# Patient Record
Sex: Female | Born: 1980 | Race: White | Hispanic: No | Marital: Married | Smoking: Former smoker
Health system: Southern US, Community
[De-identification: ages and names within clinical notes are randomized; demographics above are authoritative.]

## PROBLEM LIST (undated history)

## (undated) DIAGNOSIS — T7840XA Allergy, unspecified, initial encounter: Secondary | ICD-10-CM

## (undated) DIAGNOSIS — Z8481 Family history of carrier of genetic disease: Secondary | ICD-10-CM

## (undated) DIAGNOSIS — I10 Essential (primary) hypertension: Secondary | ICD-10-CM

## (undated) DIAGNOSIS — Z1371 Encounter for nonprocreative screening for genetic disease carrier status: Secondary | ICD-10-CM

## (undated) DIAGNOSIS — O139 Gestational [pregnancy-induced] hypertension without significant proteinuria, unspecified trimester: Secondary | ICD-10-CM

## (undated) DIAGNOSIS — E669 Obesity, unspecified: Secondary | ICD-10-CM

## (undated) DIAGNOSIS — F419 Anxiety disorder, unspecified: Secondary | ICD-10-CM

## (undated) HISTORY — DX: Essential (primary) hypertension: I10

## (undated) HISTORY — DX: Obesity, unspecified: E66.9

## (undated) HISTORY — DX: Allergy, unspecified, initial encounter: T78.40XA

## (undated) HISTORY — DX: Encounter for nonprocreative screening for genetic disease carrier status: Z13.71

## (undated) HISTORY — DX: Anxiety disorder, unspecified: F41.9

## (undated) HISTORY — DX: Gestational (pregnancy-induced) hypertension without significant proteinuria, unspecified trimester: O13.9

## (undated) HISTORY — DX: Family history of carrier of genetic disease: Z84.81

---

## 2007-07-31 ENCOUNTER — Observation Stay: Payer: Self-pay

## 2007-09-26 ENCOUNTER — Observation Stay: Payer: Self-pay | Admitting: Obstetrics & Gynecology

## 2007-10-01 ENCOUNTER — Observation Stay: Payer: Self-pay | Admitting: Obstetrics & Gynecology

## 2007-10-12 ENCOUNTER — Observation Stay: Payer: Self-pay | Admitting: Obstetrics and Gynecology

## 2007-10-14 ENCOUNTER — Inpatient Hospital Stay: Payer: Self-pay | Admitting: Unknown Physician Specialty

## 2007-10-16 DIAGNOSIS — O139 Gestational [pregnancy-induced] hypertension without significant proteinuria, unspecified trimester: Secondary | ICD-10-CM

## 2009-06-03 ENCOUNTER — Emergency Department: Payer: Self-pay | Admitting: Emergency Medicine

## 2010-01-24 ENCOUNTER — Inpatient Hospital Stay: Payer: Self-pay

## 2011-11-19 ENCOUNTER — Ambulatory Visit: Payer: Self-pay | Admitting: Obstetrics and Gynecology

## 2011-11-19 LAB — CBC WITH DIFFERENTIAL/PLATELET
Basophil %: 0.4 %
Eosinophil #: 0.1 10*3/uL (ref 0.0–0.7)
Eosinophil %: 0.8 %
HCT: 37.8 % (ref 35.0–47.0)
HGB: 12.9 g/dL (ref 12.0–16.0)
Lymphocyte #: 2.4 10*3/uL (ref 1.0–3.6)
Lymphocyte %: 20.7 %
MCV: 92 fL (ref 80–100)
Monocyte %: 3.7 %
Neutrophil #: 8.8 10*3/uL — ABNORMAL HIGH (ref 1.4–6.5)
RBC: 4.11 10*6/uL (ref 3.80–5.20)
RDW: 13.5 % (ref 11.5–14.5)
WBC: 11.8 10*3/uL — ABNORMAL HIGH (ref 3.6–11.0)

## 2011-11-20 ENCOUNTER — Inpatient Hospital Stay: Payer: Self-pay | Admitting: Obstetrics and Gynecology

## 2011-11-21 LAB — HEMATOCRIT: HCT: 34.7 % — ABNORMAL LOW (ref 35.0–47.0)

## 2014-01-28 ENCOUNTER — Other Ambulatory Visit: Payer: Self-pay | Admitting: Nurse Practitioner

## 2014-01-28 DIAGNOSIS — Z803 Family history of malignant neoplasm of breast: Secondary | ICD-10-CM

## 2014-09-12 NOTE — Op Note (Signed)
PATIENT NAME:  Elizabeth Stevens, Elizabeth Stevens MR#:  [id number] DATE OF BIRTH:  July 04, 1980  DATE OF PROCEDURE:  11/20/2011  PREOPERATIVE DIAGNOSIS: Prior cesarean section.   POSTOPERATIVE DIAGNOSIS: Prior cesarean section.   PROCEDURE: Repeat low transverse cesarean section.   SURGEON: Senaida LangeLashawn Weaver-Lee, MD   ASSISTANT: Annamarie MajorPaul Harris, MD   ANESTHESIA: Spinal.   ESTIMATED BLOOD LOSS: 500 mL.  OPERATIVE FLUIDS: 1 liter.   COMPLICATIONS: None.   FINDINGS: Vertex female infant, 4140 grams, Apgars 9 and 9, mild adhesions of the lower uterine segment to anterior abdominal wall, thin lower uterine segment, normal uterus, tubes, and ovaries.   SPECIMEN: None.   INDICATIONS: The patient is a 34 year old G3, P2 who presents for a repeat C-section. The risks, benefits, indications, and alternatives of the procedure were explained, and informed consent was obtained.   PROCEDURE: The patient was taken to the Operating Room with IV fluids running. She was prepped and draped in the usual sterile fashion with a leftward tilt. A Pfannenstiel skin incision was made and carried down to underlying fascia with the knife. The fascia was nicked in the midline. The incision was extended laterally. The superior aspect of the fascia was grasped with Kocher's and the underlying rectus muscle dissected off using curved Mayo's. This was repeated on the inferior fascia. The peritoneum was grasped with [facility] HealthcareKelly's and entered sharply with Metzenbaum scissors. The opening was extended. A bladder blade was placed. The vesicouterine peritoneum was grasped and entered sharply with the Metzenbaum's with some difficulty secondary to adhesions. The bladder flap was created digitally. The bladder blade was replaced. A hysterotomy incision was made, carried down to underlying fetal membranes, which were ruptured. The opening was extended. The infant's head was grasped and delivered atraumatically through the hysterotomy incision. The mouth and nose  were bulb suctioned. The anterior and posterior shoulder were delivered, followed by the remainder of the body. The cord was clamped x2 and cut, and the infant was handed to the awaiting neonatologist. The placenta was expressed. The uterus was exteriorized and cleared of all clot and debris. The hysterotomy incision was repaired with a #0 Monocryl in a running locked fashion. The uterus was returned to the abdomen. The abdomen and gutters were irrigated with copious amounts of warm normal saline. The peritoneum was repaired with a 2-0 Vicryl. The On-Q pump apparatus was placed according to manufacturer's instructions. The fascia was repaired with a #1 PDS. The subcutaneous tissue was reapproximated with a 2-0 Vicryl. The skin was closed with 4-0 Vicryl in a subcuticular fashion. The On-Q pump catheters were bolused with 0.5% bupivacaine plain. The catheters were secured with Steri-Strips and Tegaderm, and the incision was dressed. The patient tolerated the procedure well. Sponge, lap, needle and instrument counts were correct x2. The patient was taken to the recovery room in stable condition.   ____________________________ Sonda PrimesLashawn A. Patton SallesWeaver-Lee, MD law:cbb D: 11/20/2011 08:39:36 ET T: 11/20/2011 13:04:05 ET JOB#: [id number]  cc: Flint MelterLashawn A. Patton SallesWeaver-Lee, MD, <Dictator> Sonda PrimesLASHAWN A WEAVER LEE MD ELECTRONICALLY SIGNED 01/04/2012 10:22

## 2016-08-15 ENCOUNTER — Ambulatory Visit (INDEPENDENT_AMBULATORY_CARE_PROVIDER_SITE_OTHER): Payer: BC Managed Care – PPO | Admitting: Certified Nurse Midwife

## 2016-08-15 ENCOUNTER — Encounter: Payer: Self-pay | Admitting: Certified Nurse Midwife

## 2016-08-15 VITALS — BP 132/82 | HR 73 | Ht 67.0 in | Wt 252.0 lb

## 2016-08-15 DIAGNOSIS — Z13228 Encounter for screening for other metabolic disorders: Secondary | ICD-10-CM

## 2016-08-15 DIAGNOSIS — Z131 Encounter for screening for diabetes mellitus: Secondary | ICD-10-CM

## 2016-08-15 DIAGNOSIS — Z01419 Encounter for gynecological examination (general) (routine) without abnormal findings: Secondary | ICD-10-CM

## 2016-08-15 DIAGNOSIS — Z13220 Encounter for screening for lipoid disorders: Secondary | ICD-10-CM

## 2016-08-15 DIAGNOSIS — Z124 Encounter for screening for malignant neoplasm of cervix: Secondary | ICD-10-CM | POA: Diagnosis not present

## 2016-08-16 LAB — LIPID PANEL WITH LDL/HDL RATIO
Cholesterol, Total: 164 mg/dL (ref 100–199)
HDL: 45 mg/dL (ref >39)
LDL Calculated: 101 mg/dL — ABNORMAL HIGH (ref 0–99)
LDl/HDL Ratio: 2.2 ratio units (ref 0.0–3.2)
Triglycerides: 88 mg/dL (ref 0–149)
VLDL Cholesterol Cal: 18 mg/dL (ref 5–40)

## 2016-08-16 LAB — TSH: TSH: 1.98 u[IU]/mL (ref 0.450–4.500)

## 2016-08-16 LAB — HGB A1C W/O EAG: Hgb A1c MFr Bld: 5.4 % (ref 4.8–5.6)

## 2016-08-17 LAB — IGP,RFX APTIMA HPV ALL PTH: PAP Smear Comment: 0

## 2016-08-28 ENCOUNTER — Encounter: Payer: Self-pay | Admitting: Certified Nurse Midwife

## 2016-08-28 MED ORDER — LEVONORGEST-ETH ESTRAD 91-DAY 0.15-0.03 &0.01 MG PO TABS: 1.0000 | ORAL_TABLET | Freq: Every day | ORAL | 1 refills | Status: DC

## 2016-08-28 NOTE — Progress Notes (Signed)
Gynecology Annual Exam  PCP: No PCP Per Patient  Chief Complaint:  Chief Complaint  Patient presents with  . Gynecologic Exam    IUD removal    History of Present Illness Elizabeth Stevens is a 36 year old [id number] WF, who presents for her annual exam. She is married and uses a Mirena IUD for contraception. She desires the Mirena removed today and desires to start a OCP. She has spotting monthly with the IUD which was inserted 01/25/2015.  Her last annual gyn exam was 12/23/2014. She has had some worsening PMS since then as well as problems with right plantar fasciitis.  Last Pap: December 23, 2014  Results were: NIL /neg HPV DNA.  Hx of STDs: none  There is a family history of breast cancer in her mother, maternal aunt, and maternal great aunt. Her maternal aunt and her aunt's daughter were positive for BRCA2. The patient tested negative for BRCA, reducing her risk of breast cancer to 13%. She does do monthly self breast exams. Last mammogram: December 23, 2014  Results were: negative. There is no family history of ovarian cancer.   Tobacco use: The patient denies current or previous tobacco use. Alcohol use: social drinker Exercise: by walking, running and using the exercise bike. Her current BMI is 39.47kg/m2.  She does get adequate calcium in her diet.  She has not had a recent lipid panel and is interested in lab work today.    Review of Systems: Review of Systems  Constitutional: Positive for weight loss (and weight gain). Negative for chills and fever.  HENT: Negative for congestion, sinus pain and sore throat.   Eyes: Negative for blurred vision and pain.  Respiratory: Negative for hemoptysis, shortness of breath and wheezing.   Cardiovascular: Negative for chest pain, palpitations and leg swelling.  Gastrointestinal: Negative for abdominal pain, blood in stool, diarrhea, heartburn, nausea and vomiting.  Genitourinary: Negative for dysuria, frequency, hematuria and urgency.  Musculoskeletal:  Negative for back pain, joint pain and myalgias.  Skin: Negative for itching and rash.  Neurological: Negative for dizziness, tingling and headaches.  Endo/Heme/Allergies: Negative for environmental allergies and polydipsia. Does not bruise/bleed easily.       Negative for hirsutism   Psychiatric/Behavioral: Negative for depression. The patient is not nervous/anxious and does not have insomnia.        Positive for mood swings premenstrually     Past Medical History:  Past Medical History:  Diagnosis Date  . BRCA negative    family hx BRCA2+ pt is at population risk (13%)   . Obesity (BMI 35.0-39.9 without comorbidity)   . Pregnancy induced hypertension     Past Surgical History:  Past Surgical History:  Procedure Laterality Date  . CESAREAN SECTION  10/16/07; 01/24/2010    Medications: Prior to Admission medications   Medication Sig Start Date End Date Taking? Authorizing Provider  levonorgestrel (MIRENA) 20 MCG/24HR IUD 1 each by Intrauterine route once.   Yes Historical Provider, MD    Allergies:  No Known Allergies  Gynecologic History: Spotting monthly on the Mirena. Obstetric History: [id number] Cesarean sections x 3  Social History:  Social History   Social History  . Marital status: Married    Spouse name: N/A  . Number of children: 3  . Years of education: N/A   Occupational History  . Teacher     [employer]   Social History Main Topics  . Smoking status: Never Smoker  . Smokeless tobacco: Never Used  .  Alcohol use Yes  . Drug use: No  . Sexual activity: Yes    Birth control/ protection: IUD   Other Topics Concern  . Not on file   Social History Narrative  . No narrative on file    Family History:  Family History  Problem Relation Age of Onset  . Breast cancer Mother 39  . Hypertension Father   . Seizures Sister   . Breast cancer Maternal Aunt 40  . Heart failure Maternal Aunt   . Cervical cancer Paternal Grandmother 52  .  Heart failure Paternal Grandfather      Physical Exam Vitals: BP 132/82   Pulse 73   Ht _0  (1.702 m)   Wt 114.3 kg (252 lb)   BMI 39.47 kg/m  Physical Exam  Constitutional: She is oriented to person, place, and time. She appears well-nourished.  Obese WF, pleasant, in NAD  HENT:  Head: Normocephalic and atraumatic.  Neck: Normal range of motion. Neck supple. No thyromegaly present.  Cardiovascular: Normal rate and regular rhythm.   No murmur heard. Respiratory: Effort normal and breath sounds normal. She has no wheezes. She has no rales. Right breast exhibits no inverted nipple, no mass, no nipple discharge, no skin change and no tenderness. Left breast exhibits no inverted nipple, no mass, no nipple discharge, no skin change and no tenderness.  No axillary, infraclavicular or supraclavicular adenopathy.  GI: Soft. She exhibits no distension and no mass. There is no tenderness. There is no rebound and no guarding.  Genitourinary: Rectum normal.  Genitourinary Comments: Vulva: no lesions or inflammation Vagina: no lesions, no bleeding Cervix: IUD strings seen. IUD strings grasped with packing forceps and IUD was removed easily intact. Uterus: nonenlarged, normal contour Position: anteverted   mobile, non-tender Adnexa: no masses, NT. Exam limited due to body habitus.   Musculoskeletal: Normal range of motion.  Lymphadenopathy:    She has no cervical adenopathy.       Right: No inguinal adenopathy present.       Left: No inguinal adenopathy present.  Neurological: She is alert and oriented to person, place, and time.  Skin: Skin is warm and dry. No rash noted.  Psychiatric: She has a normal mood and affect. Her behavior is normal. Judgment and thought content normal.     Assessment: 36 y.o. [id number] well woman exam Contraceptive management   Plan:  1) IUD removed as requested. Contraception Education given regarding options for contraception.  Would like to take a BCP.  She has no history of thrombophilic disease and does not smoke. RX for [employer] given with instructions on how to take pills, possible side effects, as well as risk of thrombophilia. Discussed what to do for breakthrough bleeding. FU in 2-3 months for BP check.  2) Pap done  3) Routine healthcare maintenance including cholesterol, diabetes screening done  4) Follow up 2-3 months.     Dalia Heading, CNM  08/28/2016 9:49 PM

## 2016-08-29 ENCOUNTER — Encounter: Payer: Self-pay | Admitting: Certified Nurse Midwife

## 2016-09-05 ENCOUNTER — Ambulatory Visit (INDEPENDENT_AMBULATORY_CARE_PROVIDER_SITE_OTHER): Payer: BC Managed Care – PPO | Admitting: Obstetrics & Gynecology

## 2016-09-05 ENCOUNTER — Encounter: Payer: Self-pay | Admitting: Obstetrics & Gynecology

## 2016-09-05 VITALS — BP 130/90 | HR 74 | Ht 67.0 in | Wt 258.0 lb

## 2016-09-05 DIAGNOSIS — N3000 Acute cystitis without hematuria: Secondary | ICD-10-CM

## 2016-09-05 LAB — POCT URINALYSIS DIPSTICK
Bilirubin, UA: NEGATIVE
Blood, UA: NEGATIVE
Glucose, UA: NEGATIVE
KETONES UA: NEGATIVE
Leukocytes, UA: NEGATIVE
Nitrite, UA: NEGATIVE
Protein, UA: NEGATIVE
Spec Grav, UA: 1.015 (ref 1.010–1.025)
Urobilinogen, UA: 0.2 E.U./dL
pH, UA: 5.5 (ref 5.0–8.0)

## 2016-09-05 MED ORDER — SULFAMETHOXAZOLE-TRIMETHOPRIM 800-160 MG PO TABS: 1.0000 | ORAL_TABLET | Freq: Two times a day (BID) | ORAL | 1 refills | Status: DC

## 2016-09-05 NOTE — Patient Instructions (Signed)
Urinary Tract Infection, Adult A urinary tract infection (UTI) is an infection of any part of the urinary tract. The urinary tract includes the:  Kidneys.  Ureters.  Bladder.  Urethra. These organs make, store, and get rid of pee (urine) in the body. Follow these instructions at home:  Take over-the-counter and prescription medicines only as told by your doctor.  If you were prescribed an antibiotic medicine, take it as told by your doctor. Do not stop taking the antibiotic even if you start to feel better.  Avoid the following drinks:  Alcohol.  Caffeine.  Tea.  Carbonated drinks.  Drink enough fluid to keep your pee clear or pale yellow.  Keep all follow-up visits as told by your doctor. This is important.  Make sure to:  Empty your bladder often and completely. Do not to hold pee for long periods of time.  Empty your bladder before and after sex.  Wipe from front to back after a bowel movement if you are female. Use each tissue one time when you wipe. Contact a doctor if:  You have back pain.  You have a fever.  You feel sick to your stomach (nauseous).  You throw up (vomit).  Your symptoms do not get better after 3 days.  Your symptoms go away and then come back. Get help right away if:  You have very bad back pain.  You have very bad lower belly (abdominal) pain.  You are throwing up and cannot keep down any medicines or water. This information is not intended to replace advice given to you by your health care provider. Make sure you discuss any questions you have with your health care provider. Document Released: 10/24/2007 Document Revised: 10/13/2015 Document Reviewed: 03/28/2015 Elsevier Interactive Patient Education  2017 Elsevier Inc.  Sulfamethoxazole; Trimethoprim, SMX-TMP tablets What is this medicine? SULFAMETHOXAZOLE; TRIMETHOPRIM or SMX-TMP (suhl fuh meth OK suh zohl; trye METH oh prim) is a combination of a sulfonamide antibiotic and  a second antibiotic, trimethoprim. It is used to treat or prevent certain kinds of bacterial infections. It will not work for colds, flu, or other viral infections. This medicine may be used for other purposes; ask your health care provider or pharmacist if you have questions. COMMON BRAND NAME(S): Bacter-Aid DS, Bactrim, Bactrim DS, Septra, Septra DS What should I tell my health care provider before I take this medicine? They need to know if you have any of these conditions: -anemia -asthma -being treated with anticonvulsants -if you frequently drink alcohol containing drinks -kidney disease -liver disease -low level of folic acid or GNFAOZH-0-QMVHQIONG dehydrogenase -poor nutrition or malabsorption -porphyria -severe allergies -thyroid disorder -an unusual or allergic reaction to sulfamethoxazole, trimethoprim, sulfa drugs, other medicines, foods, dyes, or preservatives -pregnant or trying to get pregnant -breast-feeding How should I use this medicine? Take this medicine by mouth with a full glass of water. Follow the directions on the prescription label. Take your medicine at regular intervals. Do not take it more often than directed. Do not skip doses or stop your medicine early. Talk to your pediatrician regarding the use of this medicine in children. Special care may be needed. This medicine has been used in children as young as 57 months of age. Overdosage: If you think you have taken too much of this medicine contact a poison control center or emergency room at once. NOTE: This medicine is only for you. Do not share this medicine with others. What if I miss a dose? If you miss a  dose, take it as soon as you can. If it is almost time for your next dose, take only that dose. Do not take double or extra doses. What may interact with this medicine? Do not take this medicine with any of the following medications: -aminobenzoate potassium -dofetilide -metronidazole This medicine may  also interact with the following medications: -ACE inhibitors like benazepril, enalapril, lisinopril, and ramipril -birth control pills -cyclosporine -digoxin -diuretics -indomethacin -medicines for diabetes -methenamine -methotrexate -phenytoin -potassium supplements -pyrimethamine -sulfinpyrazone -tricyclic antidepressants -warfarin This list may not describe all possible interactions. Give your health care provider a list of all the medicines, herbs, non-prescription drugs, or dietary supplements you use. Also tell them if you smoke, drink alcohol, or use illegal drugs. Some items may interact with your medicine. What should I watch for while using this medicine? Tell your doctor or health care professional if your symptoms do not improve. Drink several glasses of water a day to reduce the risk of kidney problems. Do not treat diarrhea with over the counter products. Contact your doctor if you have diarrhea that lasts more than 2 days or if it is severe and watery. This medicine can make you more sensitive to the sun. Keep out of the sun. If you cannot avoid being in the sun, wear protective clothing and use a sunscreen. Do not use sun lamps or tanning beds/booths. What side effects may I notice from receiving this medicine? Side effects that you should report to your doctor or health care professional as soon as possible: -allergic reactions like skin rash or hives, swelling of the face, lips, or tongue -breathing problems -fever or chills, sore throat -irregular heartbeat, chest pain -joint or muscle pain -pain or difficulty passing urine -red pinpoint spots on skin -redness, blistering, peeling or loosening of the skin, including inside the mouth -unusual bleeding or bruising -unusually weak or tired -yellowing of the eyes or skin Side effects that usually do not require medical attention (report to your doctor or health care professional if they continue or are  bothersome): -diarrhea -dizziness -headache -loss of appetite -nausea, vomiting -nervousness This list may not describe all possible side effects. Call your doctor for medical advice about side effects. You may report side effects to FDA at 1-800-FDA-1088. Where should I keep my medicine? Keep out of the reach of children. Store at room temperature between 20 to 25 degrees C (68 to 77 degrees F). Protect from light. Throw away any unused medicine after the expiration date. NOTE: This sheet is a summary. It may not cover all possible information. If you have questions about this medicine, talk to your doctor, pharmacist, or health care provider.  2018 Elsevier/Gold Standard (2012-12-12 14:38:26)

## 2016-09-05 NOTE — Progress Notes (Signed)
HPI:      [name] is a 36 y.o. [phone] who LMP was Patient's last menstrual period was 08/17/2016., presents today for a problem visit.    Urinary Tract Infection: Patient complains of frequency and incontinence . She has had symptoms for 3 days. Patient also complains of no other sx's. Patient denies back pain, fever and vaginal discharge. Patient does not have a history of recurrent UTI.  Patient does not have a history of pyelonephritis. Recent IUD removal and start of OCP.  PMHx: She  has a past medical history of BRCA negative; Obesity (BMI 35.0-39.9 without comorbidity); and Pregnancy induced hypertension. Also,  has a past surgical history that includes Cesarean section (10/16/07; 01/24/2010)., family history includes Breast cancer in her other; Breast cancer (age of onset: 37) in her mother; Breast cancer (age of onset: 103) in her maternal aunt; Cervical cancer (age of onset: 75) in her paternal grandmother; Heart failure in her maternal aunt and paternal grandfather; Hypertension in her father; Seizures in her sister.,  reports that she has never smoked. She has never used smokeless tobacco. She reports that she drinks alcohol. She reports that she does not use drugs.  She has a current medication list which includes the following prescription(s): levonorgestrel-ethinyl estradiol and sulfamethoxazole-trimethoprim. Also, has No Known Allergies.  Review of Systems  Constitutional: Negative for chills, fever and malaise/fatigue.  HENT: Negative for congestion, sinus pain and sore throat.   Eyes: Negative for blurred vision and pain.  Respiratory: Negative for cough and wheezing.   Cardiovascular: Negative for chest pain and leg swelling.  Gastrointestinal: Negative for abdominal pain, constipation, diarrhea, heartburn, nausea and vomiting.  Genitourinary: Negative for dysuria, frequency, hematuria and urgency.  Musculoskeletal: Negative for back pain, joint pain, myalgias and  neck pain.  Skin: Negative for itching and rash.  Neurological: Negative for dizziness, tremors and weakness.  Endo/Heme/Allergies: Does not bruise/bleed easily.  Psychiatric/Behavioral: Negative for depression. The patient is not nervous/anxious and does not have insomnia.     Objective: BP 130/90   Pulse 74   Ht 5' 7"  (1.702 m)   Wt 258 lb (117 kg)   LMP 08/17/2016   BMI 40.41 kg/m  Physical Exam  Constitutional: She is oriented to person, place, and time. She appears well-developed and well-nourished. No distress.  Musculoskeletal: Normal range of motion.  Neurological: She is alert and oriented to person, place, and time.  Skin: Skin is warm and dry.  Psychiatric: She has a normal mood and affect.  Vitals reviewed. No CVAT  Results for orders placed or performed in visit on 09/05/16  POCT urinalysis dipstick  Result Value Ref Range   Color, UA yellow    Clarity, UA clear    Glucose, UA neg    Bilirubin, UA neg    Ketones, UA neg    Spec Grav, UA 1.015 1.010 - 1.025   Blood, UA neg    pH, UA 5.5 5.0 - 8.0   Protein, UA neg    Urobilinogen, UA 0.2 0.2 or 1.0 E.U./dL   Nitrite, UA neg    Leukocytes, UA POS Negative    ASSESSMENT/PLAN:   Acute cystitis   Visit Diagnoses    Acute cystitis without hematuria    -  Primary   Relevant Medications   sulfamethoxazole-trimethoprim (BACTRIM DS,SEPTRA DS) 800-160 MG tablet   Other Relevant Orders   POCT urinalysis dipstick (Completed)   Urine culture      Barnett Applebaum, MD, [facility]  [facility], [facility] 09/05/2016  1:09 PM

## 2016-09-07 ENCOUNTER — Other Ambulatory Visit: Payer: Self-pay | Admitting: Certified Nurse Midwife

## 2016-09-07 DIAGNOSIS — Z1231 Encounter for screening mammogram for malignant neoplasm of breast: Secondary | ICD-10-CM

## 2016-09-07 LAB — URINE CULTURE

## 2016-09-12 ENCOUNTER — Telehealth: Payer: Self-pay

## 2016-09-12 MED ORDER — URIBEL 118 MG PO CAPS
1.0000 | ORAL_CAPSULE | Freq: Four times a day (QID) | ORAL | 2 refills | Status: DC | PRN
Start: 2016-09-12 — End: 2016-09-13

## 2016-09-12 NOTE — Telephone Encounter (Signed)
Urine culture negative.  Frequency and incontinence could be for other reasons.  Will Rx medicine to calm down bladder pH and irritative effect, and see if improves.  Can see pt back if still no better here.   Rx will be for Uribel q 6 hours as needed, turns urine blue.  Try for 2-3 days.    I put order in but no pharmacy again listed for this patient, so you may have to call it in.

## 2016-09-12 NOTE — Telephone Encounter (Signed)
Pt came in last week to see [facility] & was dx w/UTI. She receive rx a 3 day rx & her s&s weren't better so she received an additional 3 days which she has completed. She finished meds yesterday & still doesn't feel she is well. She is still urinating frequently & leaking urine. Questions if she needs a different rx. [phone].

## 2016-09-13 ENCOUNTER — Other Ambulatory Visit: Payer: Self-pay

## 2016-09-13 DIAGNOSIS — N393 Stress incontinence (female) (male): Secondary | ICD-10-CM

## 2016-09-13 MED ORDER — URIBEL 118 MG PO CAPS: 1.0000 | ORAL_CAPSULE | Freq: Four times a day (QID) | ORAL | 2 refills | Status: DC | PRN

## 2016-09-13 NOTE — Telephone Encounter (Signed)
rx sent to [facility] [address]. Pt aware

## 2016-09-26 ENCOUNTER — Ambulatory Visit
Admission: RE | Admit: 2016-09-26 | Discharge: 2016-09-26 | Disposition: A | Discharge status: Home / Self Care | Payer: BC Managed Care – PPO | Source: Ambulatory Visit | Attending: Certified Nurse Midwife | Admitting: Certified Nurse Midwife

## 2016-09-26 DIAGNOSIS — Z1231 Encounter for screening mammogram for malignant neoplasm of breast: Secondary | ICD-10-CM | POA: Insufficient documentation

## 2016-10-01 ENCOUNTER — Other Ambulatory Visit: Payer: Self-pay | Admitting: *Deleted

## 2016-10-01 ENCOUNTER — Inpatient Hospital Stay
Admission: RE | Admit: 2016-10-01 | Discharge: 2016-10-01 | Disposition: A | Discharge status: Home / Self Care | Payer: Self-pay | Source: Ambulatory Visit | Attending: *Deleted | Admitting: *Deleted

## 2016-10-01 DIAGNOSIS — Z9289 Personal history of other medical treatment: Secondary | ICD-10-CM

## 2016-10-18 ENCOUNTER — Encounter: Payer: Self-pay | Admitting: Certified Nurse Midwife

## 2016-10-18 ENCOUNTER — Ambulatory Visit (INDEPENDENT_AMBULATORY_CARE_PROVIDER_SITE_OTHER): Payer: BC Managed Care – PPO | Admitting: Certified Nurse Midwife

## 2016-10-18 VITALS — BP 140/90 | HR 72 | Ht 67.0 in | Wt 257.0 lb

## 2016-10-18 DIAGNOSIS — Z803 Family history of malignant neoplasm of breast: Secondary | ICD-10-CM | POA: Insufficient documentation

## 2016-10-18 DIAGNOSIS — O139 Gestational [pregnancy-induced] hypertension without significant proteinuria, unspecified trimester: Secondary | ICD-10-CM | POA: Insufficient documentation

## 2016-10-18 DIAGNOSIS — R030 Elevated blood-pressure reading, without diagnosis of hypertension: Secondary | ICD-10-CM

## 2016-10-18 DIAGNOSIS — Z3041 Encounter for surveillance of contraceptive pills: Secondary | ICD-10-CM | POA: Diagnosis not present

## 2016-10-18 DIAGNOSIS — Z1371 Encounter for nonprocreative screening for genetic disease carrier status: Secondary | ICD-10-CM | POA: Insufficient documentation

## 2016-10-18 DIAGNOSIS — E669 Obesity, unspecified: Secondary | ICD-10-CM | POA: Insufficient documentation

## 2016-10-18 NOTE — Progress Notes (Signed)
  History of Present Illness:  Elizabeth Stevens is a 36 y.o. who was started on  Current Outpatient Prescriptions on File Prior to Visit  Medication Sig Dispense Refill  . Levonorgestrel-Ethinyl Estradiol (AMETHIA,CAMRESE) 0.15-0.03 &0.01 MG tablet Take 1 tablet by mouth daily. 1 Package 1   No current facility-administered medications on file prior to visit.    approximately 2 months ago after her IUD was removed.. Since that time, she reports having headaches in the frontal/ ethmoid area 2-3 times/week. She reports that the headaches are not more frequent on the birtn control pills. The headaches are relieved with Excedrin Migraine. She has no aura or visual changes with her headaches. 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 She has a history of PIH. No nausea and vomiting and no break through bleeding.  PMHx: She  has a past medical history of BRCA negative; Obesity (BMI 35.0-39.9 without comorbidity); and Pregnancy induced hypertension. Also,  has a past surgical history that includes Cesarean section (10/16/07; 01/24/2010)., family history includes BRCA 1/2 in her cousin and maternal aunt; Breast cancer in her other; Breast cancer (age of onset: 89) in her mother; Breast cancer (age of onset: 50) in her maternal aunt; Cervical cancer (age of onset: 55) in her paternal grandmother; Heart failure in her maternal aunt and paternal grandfather; Hypertension in her father; Seizures in her sister.,  reports that she has never smoked. She has never used smokeless tobacco. She reports that she drinks alcohol. She reports that she does not use drugs.  She has a current medication list which includes the following prescription(s): levonorgestrel-ethinyl estradiol. Also, has No Known Allergies.  Review of Systems  Constitutional: Negative for chills, fever and weight loss.       Positive for a five pound weight gain  HENT: Positive  for sinus pain.   Cardiovascular: Negative for chest pain.  Gastrointestinal: Negative for nausea and vomiting.  Neurological: Positive for headaches. Negative for speech change and focal weakness.  Endo/Heme/Allergies: Positive for environmental allergies.    Physical Exam:  BP 140/90   Pulse 72   Ht '5\' 7"'$  (1.702 m)   Wt 116.6 kg (257 lb)   LMP 10/13/2016 (Exact Date)   BMI 40.25 kg/m  Body mass index is 40.25 kg/m. Constitutional: Well nourished, well developed female in no acute distress.   Neuro: Grossly intact Psych:  Normal mood and affect.    Assessment: contraceptive surveillance on OCPs Headaches probably R/T sinus congestion Elevated blood pressure on OCPs  Plan: Will have patient follow up in another month for blood pressure check If blood pressure still elevated on the BCPs, will try reducing estrogen or discuss contraception without estrogen  She was amenable to this plan and we will see her back in 1 month  Dalia Heading, [place], [place] 10/18/2016  9:07 PM

## 2016-11-16 ENCOUNTER — Ambulatory Visit: Payer: BC Managed Care – PPO | Admitting: Certified Nurse Midwife

## 2016-12-06 ENCOUNTER — Encounter: Payer: Self-pay | Admitting: Certified Nurse Midwife

## 2016-12-06 ENCOUNTER — Ambulatory Visit (INDEPENDENT_AMBULATORY_CARE_PROVIDER_SITE_OTHER): Payer: BC Managed Care – PPO | Admitting: Certified Nurse Midwife

## 2016-12-06 VITALS — BP 148/104 | HR 68 | Ht 67.0 in | Wt 258.0 lb

## 2016-12-06 DIAGNOSIS — Z3041 Encounter for surveillance of contraceptive pills: Secondary | ICD-10-CM | POA: Diagnosis not present

## 2016-12-06 DIAGNOSIS — R030 Elevated blood-pressure reading, without diagnosis of hypertension: Secondary | ICD-10-CM

## 2016-12-06 MED ORDER — NORETHINDRONE 0.35 MG PO TABS: 1.0000 | ORAL_TABLET | Freq: Every day | ORAL | 11 refills | Status: DC

## 2016-12-06 NOTE — Progress Notes (Signed)
  History of Present Illness:  Elizabeth Stevens is a 36 y.o. who was started on [facility] for contraception after her Mirena was removed approximately 4 months ago. She returns today for a blood pressure check. Her blood pressure was elevated in May, but she was not feeling well and her cat had died that day. She has a history of headaches in the ethmoid/frontal area and she awoke with a headache this AM. She is currently on her menses, has not taken her pills since Saturday.   PMHx: She  has a past medical history of BRCA negative; Obesity (BMI 35.0-39.9 without comorbidity); and Pregnancy induced hypertension. Also,  has a past surgical history that includes Cesarean section (10/16/07; 01/24/2010)., family history includes BRCA 1/2 in her cousin and maternal aunt; Breast cancer in her other; Breast cancer (age of onset: 83) in her mother; Breast cancer (age of onset: 83) in her maternal aunt; Cervical cancer (age of onset: 31) in her paternal grandmother; Heart failure in her maternal aunt and paternal grandfather; Hypertension in her father; Seizures in her sister.,  reports that she has never smoked. She has never used smokeless tobacco. She reports that she drinks alcohol. She reports that she does not use drugs.  Current medications include Seasonique generic and a multivitamin Also, has No Known Allergies.  ROS  Physical Exam:  BP (!) 148/104   Pulse 68   Ht '5\' 7"'$  (1.702 m)   Wt 117 kg (258 lb)   LMP 12/02/2016 (Exact Date)   BMI 40.41 kg/m  Body mass index is 40.41 kg/m. Constitutional: Well nourished, well developed female in no acute distress.  Abdomen: diffusely non tender to palpation, non distended, and no masses, hernias Neuro: Grossly intact Psych:  Normal mood and affect.    Assessment: elevated blood pressure-probable essential hypertension  Plan: DC Seasonique Discussed non estrogen containing contraception including minipill, Depo, IUDS and Nexplanon as well as  permanent forms of birth control. Patient would like to start minipill while considering options and will return for a follow up blood pressure in 6 weeks May need referral to internist/ FP provider if blood pressures remain elevated.  Dalia Heading, CNM [facility] 12/06/2016  11:05 AM

## 2017-01-24 ENCOUNTER — Ambulatory Visit: Payer: BC Managed Care – PPO | Admitting: Certified Nurse Midwife

## 2017-02-19 ENCOUNTER — Ambulatory Visit: Payer: BC Managed Care – PPO | Admitting: Certified Nurse Midwife

## 2017-09-17 ENCOUNTER — Other Ambulatory Visit: Payer: Self-pay | Admitting: Certified Nurse Midwife

## 2017-10-07 ENCOUNTER — Telehealth: Payer: Self-pay

## 2017-10-07 NOTE — Telephone Encounter (Signed)
Pt calling triage states that she called [facility] to schedule mammo. She gets them yearly bc of hx of breast cancer in family and mom died at young age of breast cancer. Pt aware she is due for annual after 5/31 this year. Please call and schedule pt and send to whoever she schedules annual with to let them know she will need referral for mammo. Thank you. She is aware you will be calling her

## 2017-10-08 NOTE — Telephone Encounter (Signed)
Called and left voice mail for patient to call back to be schedule °

## 2017-11-19 ENCOUNTER — Telehealth: Payer: Self-pay

## 2017-11-19 ENCOUNTER — Other Ambulatory Visit: Payer: Self-pay

## 2017-11-19 MED ORDER — NORETHINDRONE 0.35 MG PO TABS: 1.0000 | ORAL_TABLET | Freq: Every day | ORAL | 0 refills | Status: DC

## 2017-11-19 NOTE — Telephone Encounter (Signed)
Pt called for a refill on BC, she's aware it has been sent in

## 2017-11-28 NOTE — Progress Notes (Signed)
Gynecology Annual Exam      PCP: Patient, No Pcp Per  Chief Complaint:  Chief Complaint  Patient presents with  . Gynecologic Exam    feeling a lot of aggitation; mammogram; BP - has appt in Oct for; two wks between last two cycles    History of Present Illness Elizabeth Stevens is a 37 year old [id number] WF, who presents for her annual exam. At the time of her last annual exam she had her Mirena removed and started a  OCP.  She returned for a blood pressure checks 8 weeks and 12 weeks later and had elevated blood pressures of 140/90 and 148/104. She was then switched to a minipill 11/2016 and she did not return for a blood pressure check.. Her menses on the minipill are usually monthly and last 5 days with a heavier flow on 1-2 days requiring a tampon change every 2 hours .  Last month she had 2 menses 2 weeks apart. She also had some cramping with her last menses. Her LMP was 11/26/2017. She also reports feeling more agitated, sometimes tearful,  and irritated around the time of her menses x 2 months. Has been having more PMS symptoms for the las 1-2 years  Her last annual gyn exam was 08/15/2016.  Last Pap: 08/15/2016 Results were: NIL  Hx of STDs: none  There is a family history of breast cancer in her mother, maternal aunt, and maternal great aunt. Her maternal aunt and her aunt's daughter were positive for BRCA2. The patient tested negative for BRCA, reducing her risk of breast cancer to 13%. She does do monthly self breast exams. Last mammogram: 09/26/2016  Results were: negative. There is no family history of ovarian cancer.   Tobacco use: The patient denies current tobacco use. Alcohol use: social drinker-drinks a bottle of wine every other weekend Exercise: by walking, running and using weights. Her current BMI is 41.66 kg/m2. She has gained 12# since her last visit.  She does get adequate calcium in her diet.  She has had a recent lipid panel 2018 and it was normal.    Review of  Systems: Review of Systems  Constitutional: Negative for chills and fever.       Positive for weight gain of 12# in last year  HENT: Negative for congestion, sinus pain and sore throat.   Eyes: Negative for blurred vision and pain.  Respiratory: Negative for hemoptysis, shortness of breath and wheezing.   Cardiovascular: Negative for chest pain, palpitations and leg swelling.  Gastrointestinal: Negative for abdominal pain, blood in stool, diarrhea, heartburn, nausea and vomiting.  Genitourinary: Negative for dysuria, frequency, hematuria and urgency.  Musculoskeletal: Negative for back pain, joint pain and myalgias.  Skin: Positive for itching. Negative for rash.  Neurological: Positive for headaches (between eyes and accompanied by photophobia and nausea and vomiting at times.). Negative for dizziness and tingling.  Endo/Heme/Allergies: Positive for environmental allergies. Negative for polydipsia. Does not bruise/bleed easily.       Negative for hirsutism   Psychiatric/Behavioral: Negative for depression. The patient is nervous/anxious (mood swings and irritability around menses.). The patient does not have insomnia.        Positive for mood swings/ agitation premenstrually     Past Medical History:  Past Medical History:  Diagnosis Date  . BRCA negative    family hx BRCA2+ pt is at population risk (13%)   . Hypertension   . Obesity (BMI 35.0-39.9 without comorbidity)    BMI 41 on 11/29/2017  .  Pregnancy induced hypertension     Past Surgical History:  Past Surgical History:  Procedure Laterality Date  . CESAREAN SECTION  10/16/07; 01/24/2010; 11/20/2011   Medications: Camila and Current Outpatient Medications on File Prior to Visit  Medication Sig Dispense Refill  . Multiple Vitamin (MULTIVITAMIN) tablet Take 1 tablet by mouth daily.     No current facility-administered medications on file prior to visit.    Allergies:  No Known Allergies  Gynecologic History: Spotting  monthly on the Mirena. Obstetric History: [id number] Cesarean sections x 3 OB History  Gravida Para Term Preterm AB Living  _0 SAB TAB Ectopic Multiple Live Births          3    # Outcome Date GA Lbr Len/2nd Weight Sex Delivery Anes PTL Lv  3 Term 11/20/11 [redacted]w[redacted]d 9 lb 2 oz (4.139 kg) M CS-LTranv   LIV  2 Term 01/24/10 325w0d9 lb 6 oz (4.252 kg) F CS-LTranv   LIV  1 Term 10/16/07 3931w0d lb 8 oz (4.309 kg) M CS-LTranv   LIV     Complications: Failure to Progress in First Stage, Cephalopelvic Disproportion, Gestational hypertension   Social History:  Social History   Socioeconomic History  . Marital status: Married    Spouse name: Not on file  . Number of children: 3  . Years of education: Not on file  . Highest education level: Not on file  Occupational History  . Occupation: TeaPharmacist, hospital Comment: [employer] Financial resource strain: Not on file  . Food insecurity:    Worry: Not on file    Inability: Not on file  . Transportation needs:    Medical: Not on file    Non-medical: Not on file  Tobacco Use  . Smoking status: Never Smoker  . Smokeless tobacco: Never Used  Substance and Sexual Activity  . Alcohol use: Yes  . Drug use: No  . Sexual activity: Yes    Birth control/protection: Pill  Lifestyle  . Physical activity:    Days per week: Not on file    Minutes per session: Not on file  . Stress: Not on file  Relationships  . Social connections:    Talks on phone: Not on file    Gets together: Not on file    Attends religious service: Not on file    Active member of club or organization: Not on file    Attends meetings of clubs or organizations: Not on file    Relationship status: Not on file  . Intimate partner violence:    Fear of current or ex partner: Not on file    Emotionally abused: Not on file    Physically abused: Not on file    Forced sexual activity: Not on file  Other Topics Concern  . Not on file  Social  History Narrative  . Not on file    Family History:  Family History  Problem Relation Age of Onset  . Breast cancer Mother 39 47 Hypertension Father   . Seizures Sister   . Breast cancer Maternal Aunt 45 27    +BRCA 2/ daughter also +BRCA2  . Heart failure Maternal Aunt   . Cervical cancer Paternal Grandmother 80 67 Heart failure Paternal Grandfather   . Breast cancer Other 50  . BRCA 1/2 Cousin        BRCA  2 maternal side.      Physical Exam Vitals: BP (!) 150/96   Pulse 87   Ht 5' 7.5" (1.715 m)   Wt 270 lb (122.5 kg)   LMP 11/25/2017 (Exact Date)   BMI 41.66 kg/m  Physical Exam  Constitutional: She is oriented to person, place, and time. She appears well-nourished.  Obese WF, pleasant, in NAD  HENT:  Head: Normocephalic and atraumatic.  Neck: Normal range of motion. Neck supple. No thyromegaly present.  Cardiovascular: Normal rate and regular rhythm.  No murmur heard. Respiratory: Effort normal and breath sounds normal. She has no wheezes. She has no rales. Right breast exhibits no inverted nipple, no mass, no nipple discharge, no skin change and no tenderness. Left breast exhibits no inverted nipple, no mass, no nipple discharge, no skin change and no tenderness.  No axillary, infraclavicular or supraclavicular adenopathy.  GI: Soft. She exhibits no distension and no mass. There is no tenderness. There is no rebound and no guarding.  Genitourinary: Rectum normal.  Genitourinary Comments: Vulva: no lesions or inflammation Vagina: no lesions, on menses Cervix: no lesions, closed Uterus: nonenlarged, normal contour Position: anteverted   mobile, non-tender Adnexa: no masses, NT. Exam limited due to body habitus.   Musculoskeletal: Normal range of motion.  Lymphadenopathy:    She has no cervical adenopathy.       Right: No inguinal adenopathy present.       Left: No inguinal adenopathy present.  Neurological: She is alert and oriented to person, place, and time.   Skin: Skin is warm and dry. No rash noted.  Psychiatric: She has a normal mood and affect. Her behavior is normal. Judgment and thought content normal.     Assessment: 37 y.o. G3P3003 well woman exam Contraceptive management Hypertension Family history of breast cancer   Plan:  1) Contraception Education given regarding options for contraception. Discussed non estrogen containing temporary contraception, as well as permanent contraception. Wants to continue on the progesterone only pills until hypertension is under control. RX sent to pharmacy  2) Pap done  3) BMP today. Start Norvasc 5 mgm daily. (Discussed starting antihypertensive with Dr Georgianne Fick). [place] to see if they could see her sooner than October. (Has a new patient appointment with Dr B in October). They will call her if sooner appointment available.   4) Follow up in 2 weeks for blood pressure if unable to see [facility] MD sooner.  5) Breast cancer screening/ Family history of breast cancer: Mammogram ordered.  Dalia Heading, CNM     [place], [place]  11/30/2017 1:22 PM

## 2017-11-29 ENCOUNTER — Ambulatory Visit (INDEPENDENT_AMBULATORY_CARE_PROVIDER_SITE_OTHER): Payer: BC Managed Care – PPO | Admitting: Certified Nurse Midwife

## 2017-11-29 ENCOUNTER — Encounter: Payer: Self-pay | Admitting: Certified Nurse Midwife

## 2017-11-29 ENCOUNTER — Other Ambulatory Visit (HOSPITAL_COMMUNITY)
Admission: RE | Admit: 2017-11-29 | Discharge: 2017-11-29 | Disposition: A | Discharge status: Home / Self Care | Payer: BC Managed Care – PPO | Source: Ambulatory Visit | Attending: Obstetrics and Gynecology | Admitting: Obstetrics and Gynecology

## 2017-11-29 VITALS — BP 150/96 | HR 87 | Ht 67.5 in | Wt 270.0 lb

## 2017-11-29 DIAGNOSIS — Z803 Family history of malignant neoplasm of breast: Secondary | ICD-10-CM

## 2017-11-29 DIAGNOSIS — Z01419 Encounter for gynecological examination (general) (routine) without abnormal findings: Secondary | ICD-10-CM | POA: Insufficient documentation

## 2017-11-29 DIAGNOSIS — R450 Nervousness: Secondary | ICD-10-CM

## 2017-11-29 DIAGNOSIS — Z124 Encounter for screening for malignant neoplasm of cervix: Secondary | ICD-10-CM

## 2017-11-29 DIAGNOSIS — I10 Essential (primary) hypertension: Secondary | ICD-10-CM | POA: Diagnosis not present

## 2017-11-29 DIAGNOSIS — Z1231 Encounter for screening mammogram for malignant neoplasm of breast: Secondary | ICD-10-CM

## 2017-11-29 DIAGNOSIS — Z01411 Encounter for gynecological examination (general) (routine) with abnormal findings: Secondary | ICD-10-CM | POA: Diagnosis not present

## 2017-11-29 DIAGNOSIS — Z1239 Encounter for other screening for malignant neoplasm of breast: Secondary | ICD-10-CM

## 2017-11-29 DIAGNOSIS — R112 Nausea with vomiting, unspecified: Secondary | ICD-10-CM | POA: Diagnosis not present

## 2017-11-29 DIAGNOSIS — R51 Headache: Secondary | ICD-10-CM

## 2017-11-29 MED ORDER — AMLODIPINE BESYLATE 5 MG PO TABS: 5.0000 mg | ORAL_TABLET | Freq: Every day | ORAL | 1 refills | Status: DC

## 2017-11-29 MED ORDER — NORETHINDRONE 0.35 MG PO TABS: 1.0000 | ORAL_TABLET | Freq: Every day | ORAL | 11 refills | Status: DC

## 2017-11-30 ENCOUNTER — Encounter: Payer: Self-pay | Admitting: Certified Nurse Midwife

## 2017-11-30 LAB — BASIC METABOLIC PANEL
BUN / CREAT RATIO: 16 (ref 9–23)
BUN: 12 mg/dL (ref 6–20)
CO2: 23 mmol/L (ref 20–29)
Calcium: 9.5 mg/dL (ref 8.7–10.2)
Chloride: 105 mmol/L (ref 96–106)
Creatinine, Ser: 0.77 mg/dL (ref 0.57–1.00)
GFR, EST AFRICAN AMERICAN: 113 mL/min/{1.73_m2} (ref >59)
GFR, EST NON AFRICAN AMERICAN: 98 mL/min/{1.73_m2} (ref >59)
Glucose: 89 mg/dL (ref 65–99)
Potassium: 4.8 mmol/L (ref 3.5–5.2)
SODIUM: 141 mmol/L (ref 134–144)

## 2017-12-01 ENCOUNTER — Encounter (INDEPENDENT_AMBULATORY_CARE_PROVIDER_SITE_OTHER): Payer: Self-pay

## 2017-12-02 LAB — CYTOLOGY - PAP: DIAGNOSIS: NEGATIVE

## 2017-12-03 ENCOUNTER — Encounter: Payer: Self-pay | Admitting: Family Medicine

## 2017-12-03 ENCOUNTER — Ambulatory Visit: Payer: BC Managed Care – PPO | Admitting: Family Medicine

## 2017-12-03 VITALS — BP 140/95 | HR 72 | Temp 98.2°F | Resp 16 | Ht 68.0 in | Wt 268.0 lb

## 2017-12-03 DIAGNOSIS — Z803 Family history of malignant neoplasm of breast: Secondary | ICD-10-CM

## 2017-12-03 DIAGNOSIS — Z1371 Encounter for nonprocreative screening for genetic disease carrier status: Secondary | ICD-10-CM | POA: Diagnosis not present

## 2017-12-03 DIAGNOSIS — Z6841 Body Mass Index (BMI) 40.0 and over, adult: Secondary | ICD-10-CM

## 2017-12-03 DIAGNOSIS — I10 Essential (primary) hypertension: Secondary | ICD-10-CM

## 2017-12-03 DIAGNOSIS — F411 Generalized anxiety disorder: Secondary | ICD-10-CM | POA: Diagnosis not present

## 2017-12-03 MED ORDER — CITALOPRAM HYDROBROMIDE 20 MG PO TABS: 20.0000 mg | ORAL_TABLET | Freq: Every day | ORAL | 3 refills | Status: DC

## 2017-12-03 MED ORDER — AMLODIPINE BESYLATE 5 MG PO TABS: 5.0000 mg | ORAL_TABLET | Freq: Every day | ORAL | 1 refills | Status: DC

## 2017-12-03 NOTE — Assessment & Plan Note (Signed)
Discussed importance of healthy weight management, diet, exercise 

## 2017-12-03 NOTE — Assessment & Plan Note (Signed)
As above, followed by gynecology with regular screening mammograms Patient is at population level risk for breast cancer despite this family history as she is BRCA negative

## 2017-12-03 NOTE — Patient Instructions (Addendum)
Hypertension Hypertension, commonly called high blood pressure, is when the force of blood pumping through the arteries is too strong. The arteries are the blood vessels that carry blood from the heart throughout the body. Hypertension forces the heart to work harder to pump blood and may cause arteries to become narrow or stiff. Having untreated or uncontrolled hypertension can cause heart attacks, strokes, kidney disease, and other problems. A blood pressure reading consists of a higher number over a lower number. Ideally, your blood pressure should be below 120/80. The first ("top") number is called the systolic pressure. It is a measure of the pressure in your arteries as your heart beats. The second ("bottom") number is called the diastolic pressure. It is a measure of the pressure in your arteries as the heart relaxes. What are the causes? The cause of this condition is not known. What increases the risk? Some risk factors for high blood pressure are under your control. Others are not. Factors you can change  Smoking.  Having type 2 diabetes mellitus, high cholesterol, or both.  Not getting enough exercise or physical activity.  Being overweight.  Having too much fat, sugar, calories, or salt (sodium) in your diet.  Drinking too much alcohol. Factors that are difficult or impossible to change  Having chronic kidney disease.  Having a family history of high blood pressure.  Age. Risk increases with age.  Race. You may be at higher risk if you are African-American.  Gender. Men are at higher risk than women before age 45. After age 65, women are at higher risk than men.  Having obstructive sleep apnea.  Stress. What are the signs or symptoms? Extremely high blood pressure (hypertensive crisis) may cause:  Headache.  Anxiety.  Shortness of breath.  Nosebleed.  Nausea and vomiting.  Severe chest pain.  Jerky movements you cannot control (seizures).  How is this  diagnosed? This condition is diagnosed by measuring your blood pressure while you are seated, with your arm resting on a surface. The cuff of the blood pressure monitor will be placed directly against the skin of your upper arm at the level of your heart. It should be measured at least twice using the same arm. Certain conditions can cause a difference in blood pressure between your right and left arms. Certain factors can cause blood pressure readings to be lower or higher than normal (elevated) for a short period of time:  When your blood pressure is higher when you are in a health care provider's office than when you are at home, this is called white coat hypertension. Most people with this condition do not need medicines.  When your blood pressure is higher at home than when you are in a health care provider's office, this is called masked hypertension. Most people with this condition may need medicines to control blood pressure.  If you have a high blood pressure reading during one visit or you have normal blood pressure with other risk factors:  You may be asked to return on a different day to have your blood pressure checked again.  You may be asked to monitor your blood pressure at home for 1 week or longer.  If you are diagnosed with hypertension, you may have other blood or imaging tests to help your health care provider understand your overall risk for other conditions. How is this treated? This condition is treated by making healthy lifestyle changes, such as eating healthy foods, exercising more, and reducing your alcohol intake. Your   health care provider may prescribe medicine if lifestyle changes are not enough to get your blood pressure under control, and if:  Your systolic blood pressure is above 130.  Your diastolic blood pressure is above 80.  Your personal target blood pressure may vary depending on your medical conditions, your age, and other factors. Follow these  instructions at home: Eating and drinking  Eat a diet that is high in fiber and potassium, and low in sodium, added sugar, and fat. An example eating plan is called the DASH (Dietary Approaches to Stop Hypertension) diet. To eat this way: ? Eat plenty of fresh fruits and vegetables. Try to fill half of your plate at each meal with fruits and vegetables. ? Eat whole grains, such as whole wheat pasta, brown rice, or whole grain bread. Fill about one quarter of your plate with whole grains. ? Eat or drink low-fat dairy products, such as skim milk or low-fat yogurt. ? Avoid fatty cuts of meat, processed or cured meats, and poultry with skin. Fill about one quarter of your plate with lean proteins, such as fish, chicken without skin, beans, eggs, and tofu. ? Avoid premade and processed foods. These tend to be higher in sodium, added sugar, and fat.  Reduce your daily sodium intake. Most people with hypertension should eat less than 1,500 mg of sodium a day.  Limit alcohol intake to no more than 1 drink a day for nonpregnant women and 2 drinks a day for men. One drink equals 12 oz of beer, 5 oz of wine, or 1 oz of hard liquor. Lifestyle  Work with your health care provider to maintain a healthy body weight or to lose weight. Ask what an ideal weight is for you.  Get at least 30 minutes of exercise that causes your heart to beat faster (aerobic exercise) most days of the week. Activities may include walking, swimming, or biking.  Include exercise to strengthen your muscles (resistance exercise), such as pilates or lifting weights, as part of your weekly exercise routine. Try to do these types of exercises for 30 minutes at least 3 days a week.  Do not use any products that contain nicotine or tobacco, such as cigarettes and e-cigarettes. If you need help quitting, ask your health care provider.  Monitor your blood pressure at home as told by your health care provider.  Keep all follow-up visits as  told by your health care provider. This is important. Medicines  Take over-the-counter and prescription medicines only as told by your health care provider. Follow directions carefully. Blood pressure medicines must be taken as prescribed.  Do not skip doses of blood pressure medicine. Doing this puts you at risk for problems and can make the medicine less effective.  Ask your health care provider about side effects or reactions to medicines that you should watch for. Contact a health care provider if:  You think you are having a reaction to a medicine you are taking.  You have headaches that keep coming back (recurring).  You feel dizzy.  You have swelling in your ankles.  You have trouble with your vision. Get help right away if:  You develop a severe headache or confusion.  You have unusual weakness or numbness.  You feel faint.  You have severe pain in your chest or abdomen.  You vomit repeatedly.  You have trouble breathing. Summary  Hypertension is when the force of blood pumping through your arteries is too strong. If this condition is not   controlled, it may put you at risk for serious complications.  Your personal target blood pressure may vary depending on your medical conditions, your age, and other factors. For most people, a normal blood pressure is less than 120/80.  Hypertension is treated with lifestyle changes, medicines, or a combination of both. Lifestyle changes include weight loss, eating a healthy, low-sodium diet, exercising more, and limiting alcohol. This information is not intended to replace advice given to you by your health care provider. Make sure you discuss any questions you have with your health care provider. Document Released: 05/07/2005 Document Revised: 04/04/2016 Document Reviewed: 04/04/2016 Elsevier Interactive Patient Education  2018 [employer].    Living With Anxiety After being diagnosed with an anxiety disorder, you may be  relieved to know why you have felt or behaved a certain way. It is natural to also feel overwhelmed about the treatment ahead and what it will mean for your life. With care and support, you can manage this condition and recover from it. How to cope with anxiety Dealing with stress Stress is your body's reaction to life changes and events, both good and bad. Stress can last just a few hours or it can be ongoing. Stress can play a major role in anxiety, so it is important to learn both how to cope with stress and how to think about it differently. Talk with your health care provider or a counselor to learn more about stress reduction. He or she may suggest some stress reduction techniques, such as:  Music therapy. This can include creating or listening to music that you enjoy and that inspires you.  Mindfulness-based meditation. This involves being aware of your normal breaths, rather than trying to control your breathing. It can be done while sitting or walking.  Centering prayer. This is a kind of meditation that involves focusing on a word, phrase, or sacred image that is meaningful to you and that brings you peace.  Deep breathing. To do this, expand your stomach and inhale slowly through your nose. Hold your breath for 3-5 seconds. Then exhale slowly, allowing your stomach muscles to relax.  Self-talk. This is a skill where you identify thought patterns that lead to anxiety reactions and correct those thoughts.  Muscle relaxation. This involves tensing muscles then relaxing them.  Choose a stress reduction technique that fits your lifestyle and personality. Stress reduction techniques take time and practice. Set aside 5-15 minutes a day to do them. Therapists can offer training in these techniques. The training may be covered by some insurance plans. Other things you can do to manage stress include:  Keeping a stress diary. This can help you learn what triggers your stress and ways to control  your response.  Thinking about how you respond to certain situations. You may not be able to control everything, but you can control your reaction.  Making time for activities that help you relax, and not feeling guilty about spending your time in this way.  Therapy combined with coping and stress-reduction skills provides the best chance for successful treatment. Medicines Medicines can help ease symptoms. Medicines for anxiety include:  Anti-anxiety drugs.  Antidepressants.  Beta-blockers.  Medicines may be used as the main treatment for anxiety disorder, along with therapy, or if other treatments are not working. Medicines should be prescribed by a health care provider. Relationships Relationships can play a big part in helping you recover. Try to spend more time connecting with trusted friends and family members. Consider going to  couples counseling, taking family education classes, or going to family therapy. Therapy can help you and others better understand the condition. How to recognize changes in your condition Everyone has a different response to treatment for anxiety. Recovery from anxiety happens when symptoms decrease and stop interfering with your daily activities at home or work. This may mean that you will start to:  Have better concentration and focus.  Sleep better.  Be less irritable.  Have more energy.  Have improved memory.  It is important to recognize when your condition is getting worse. Contact your health care provider if your symptoms interfere with home or work and you do not feel like your condition is improving. Where to find help and support: You can get help and support from these sources:  Self-help groups.  Online and [employer].  A trusted spiritual leader.  Couples counseling.  Family education classes.  Family therapy.  Follow these instructions at home:  Eat a healthy diet that includes plenty of vegetables, fruits,  whole grains, low-fat dairy products, and lean protein. Do not eat a lot of foods that are high in solid fats, added sugars, or salt.  Exercise. Most adults should do the following: ? Exercise for at least 150 minutes each week. The exercise should increase your heart rate and make you sweat (moderate-intensity exercise). ? Strengthening exercises at least twice a week.  Cut down on caffeine, tobacco, alcohol, and other potentially harmful substances.  Get the right amount and quality of sleep. Most adults need 7-9 hours of sleep each night.  Make choices that simplify your life.  Take over-the-counter and prescription medicines only as told by your health care provider.  Avoid caffeine, alcohol, and certain over-the-counter cold medicines. These may make you feel worse. Ask your pharmacist which medicines to avoid.  Keep all follow-up visits as told by your health care provider. This is important. Questions to ask your health care provider  Would I benefit from therapy?  How often should I follow up with a health care provider?  How long do I need to take medicine?  Are there any long-term side effects of my medicine?  Are there any alternatives to taking medicine? Contact a health care provider if:  You have a hard time staying focused or finishing daily tasks.  You spend many hours a day feeling worried about everyday life.  You become exhausted by worry.  You start to have headaches, feel tense, or have nausea.  You urinate more than normal.  You have diarrhea. Get help right away if:  You have a racing heart and shortness of breath.  You have thoughts of hurting yourself or others. If you ever feel like you may hurt yourself or others, or have thoughts about taking your own life, get help right away. You can go to your nearest emergency department or call:  Your local emergency services (911 in the [place]  A suicide crisis helpline, such as the National Suicide  Prevention Lifeline at (719)425-6550. This is open 24-hours a day.  Summary  Taking steps to deal with stress can help calm you.  Medicines cannot cure anxiety disorders, but they can help ease symptoms.  Family, friends, and partners can play a big part in helping you recover from an anxiety disorder. This information is not intended to replace advice given to you by your health care provider. Make sure you discuss any questions you have with your health care provider. Document Released: 05/01/2016 Document Revised: 05/01/2016  Document Reviewed: 05/01/2016 Elsevier Interactive Patient Education  [employer]2018 [employer]

## 2017-12-03 NOTE — Assessment & Plan Note (Signed)
New problem, but blood pressure has been elevated for at least the last year She was just started on medication 4 days ago by gynecology Her blood pressure has already improved somewhat, but is not to goal today Recent BMP reviewed with normal creatinine Continue amlodipine 5 mg daily Follow-up in 6 to 8 weeks and consider dose titration Screening lipid panel and LFTs today Advised no estrogen-containing birth control until blood pressure is well controlled

## 2017-12-03 NOTE — Progress Notes (Signed)
Patient: Elizabeth Stevens, Female    DOB: [birth year], 37 y.o.   MRN: [id number] Visit Date: 12/03/2017  Today's Provider: [name], MD   I, Martha Clan, CMA, am acting as scribe for [name], MD.  Chief Complaint  Patient presents with  . New Patient (Initial Visit)   Subjective:    New Patient Elizabeth Stevens is a 37 y.o. female who presents today as a new patient to establish care. She feels fairly well. She is c/o stress since recent diagnosis of HTN. 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  She is also concerned because she has felt very agitated and on edge.  She noticed the other day that she was crying for no reason.  She feels as though her patients runs out by the end of the workday and she is not who she wants to be with her children.  She wonders if she may suffer from anxiety.  She is never been diagnosed with this before.  She is never taken anxiety medications.  This is been going on for a few months. She reports exercising none since being on summer vacation; was exercising while school was still in session (about 3-4 times weekly). She reports she is sleeping well.  Pt recently saw her GYN on 11/29/2017, and pt's BP was elevated at that time. Pt was advised to see PCP ASAP to address this problem. She was started on Amlodipine 5 mg. She reports good compliance with treatment (started this 4 days ago), and denies side effects. Has not been checking BP at home.  She did suffer with pregnancy-induced hypertension during her first pregnancy about 10 years ago.  She has known since last year that her blood pressure was running high, but been hesitant to take anything for it. BP Readings from Last 3 Encounters:  11/29/17 (!) 150/96  12/06/16 (!) 148/104  10/18/16 140/90    She has a strong family history of breast cancer.  She has been followed by gynecology for this and been getting  regular mammograms.  She was BRCA tested and was negative. -----------------------------------------------------------------   Review of Systems  Constitutional: Negative.   HENT: Positive for sinus pressure. Negative for congestion, dental problem, drooling, ear discharge, ear pain, facial swelling, hearing loss, mouth sores, nosebleeds, postnasal drip, rhinorrhea, sinus pain, sneezing, sore throat, tinnitus, trouble swallowing and voice change.   Eyes: Negative.   Respiratory: Negative.   Cardiovascular: Negative.   Gastrointestinal: Negative.   Endocrine: Negative.   Genitourinary: Negative.   Musculoskeletal: Negative.   Skin: Negative.   Allergic/Immunologic: Negative.   Neurological: Positive for headaches. Negative for dizziness, tremors, seizures, syncope, facial asymmetry, speech difficulty, weakness, light-headedness and numbness.  Hematological: Negative.   Psychiatric/Behavioral: Positive for agitation. Negative for behavioral problems, confusion, decreased concentration, dysphoric mood, hallucinations, self-injury, sleep disturbance and suicidal ideas. The patient is nervous/anxious. The patient is not hyperactive.     Social History      She  reports that she quit smoking about 12 years ago. Her smoking use included cigarettes. She has never used smokeless tobacco. She reports that she drinks alcohol. She reports that she does not use drugs.       Social History   Socioeconomic History  . Marital status: Married    Spouse name: Jenny Reichmann  . Number of children: 3  . Years of education: 55  . Highest education  level: Bachelor's degree (e.g., BA, AB, BS)  Occupational History  . Occupation: Pharmacist, hospital    Comment: [employer]  . Financial resource strain: Not on file  . Food insecurity:    Worry: Not on file    Inability: Not on file  . Transportation needs:    Medical: Not on file    Non-medical: Not on file  Tobacco Use  . Smoking status:  Former Smoker    Types: Cigarettes    Last attempt to quit: 05/21/2005    Years since quitting: 12.5  . Smokeless tobacco: Never Used  . Tobacco comment: former social smoker  Substance and Sexual Activity  . Alcohol use: Yes    Comment: drinks wine about 2 times per month  . Drug use: No  . Sexual activity: Yes    Partners: Male    Birth control/protection: Pill  Lifestyle  . Physical activity:    Days per week: Not on file    Minutes per session: Not on file  . Stress: Not on file  Relationships  . Social connections:    Talks on phone: Not on file    Gets together: Not on file    Attends religious service: Not on file    Active member of club or organization: Not on file    Attends meetings of clubs or organizations: Not on file    Relationship status: Not on file  Other Topics Concern  . Not on file  Social History Narrative  . Not on file    Past Medical History:  Diagnosis Date  . BRCA negative    family hx BRCA2+ pt is at population risk (13%)   . Hypertension   . Obesity (BMI 35.0-39.9 without comorbidity)    BMI 41 on 11/29/2017  . Pregnancy induced hypertension      Patient Active Problem List   Diagnosis Date Noted  . Essential hypertension 12/03/2017  . GAD (generalized anxiety disorder) 12/03/2017  . Family history of breast cancer 10/18/2016  . BRCA negative   . Obesity     Past Surgical History:  Procedure Laterality Date  . CESAREAN SECTION  10/16/07; 01/24/2010; 11/20/2011    Family History        Family Status  Relation Name Status  . Mother  Deceased  . Father  Alive  . Sister  Alive  . Mat Aunt  (Not Specified)  . PGM  (Not Specified)  . PGF  (Not Specified)  . Cousin  (Not Specified)  . Neg Hx  (Not Specified)        Her family history includes BRCA 1/2 in her cousin; Breast cancer (age of onset: 26) in her mother; Breast cancer (age of onset: 18) in her maternal aunt; Cervical cancer (age of onset: 64) in her paternal grandmother;  Heart failure in her maternal aunt and paternal grandfather; Hypertension in her father; Seizures in her sister. There is no history of Colon cancer, Ovarian cancer, or Diabetes.      No Known Allergies   Current Outpatient Medications:  .  amLODipine (NORVASC) 5 MG tablet, Take 1 tablet (5 mg total) by mouth daily., Disp: 30 tablet, Rfl: 1 .  norethindrone (MICRONOR,CAMILA,ERRIN) 0.35 MG tablet, Take 1 tablet (0.35 mg total) by mouth daily., Disp: 1 Package, Rfl: 11 .  citalopram (CELEXA) 20 MG tablet, Take 1 tablet (20 mg total) by mouth daily., Disp: 30 tablet, Rfl: 3   Patient Care Team: Virginia Crews, MD  as PCP - General (Family Medicine)      Objective:   Vitals: Pulse 72   Temp 98.2 F (36.8 C) (Oral)   Resp 16   Ht _0  (1.727 m)   Wt 268 lb (121.6 kg)   LMP 11/25/2017 (Exact Date)   SpO2 99%   BMI 40.75 kg/m    Vitals:   12/03/17 1021  Pulse: 72  Resp: 16  Temp: 98.2 F (36.8 C)  TempSrc: Oral  SpO2: 99%  Weight: 268 lb (121.6 kg)  Height: _1  (1.727 m)     Physical Exam  Constitutional: She is oriented to person, place, and time. She appears well-developed and well-nourished. No distress.  HENT:  Head: Normocephalic and atraumatic.  Right Ear: External ear normal.  Left Ear: External ear normal.  Nose: Nose normal.  Mouth/Throat: Oropharynx is clear and moist.  Eyes: Pupils are equal, round, and reactive to light. Conjunctivae and EOM are normal. No scleral icterus.  Neck: Neck supple. No thyromegaly present.  Cardiovascular: Normal rate, regular rhythm, normal heart sounds and intact distal pulses.  No murmur heard. Pulmonary/Chest: Effort normal and breath sounds normal. No respiratory distress. She has no wheezes. She has no rales.  Abdominal: Soft. Bowel sounds are normal. She exhibits no distension. There is no tenderness. There is no rebound and no guarding.  Musculoskeletal: She exhibits no edema or deformity.  Lymphadenopathy:    She  has no cervical adenopathy.  Neurological: She is alert and oriented to person, place, and time.  Skin: Skin is warm and dry. Capillary refill takes less than 2 seconds. No rash noted.  Psychiatric: She has a normal mood and affect. Her behavior is normal.  Vitals reviewed.    Depression screen PHQ 2/9 12/03/2017  Decreased Interest 1  Down, Depressed, Hopeless 0  PHQ - 2 Score 1    GAD 7 : Generalized Anxiety Score 12/03/2017  Nervous, Anxious, on Edge 3  Control/stop worrying 1  Worry too much - different things 0  Trouble relaxing 1  Restless 0  Easily annoyed or irritable 2  Afraid - awful might happen 0  Total GAD 7 Score 7  Anxiety Difficulty Somewhat difficult      Assessment & Plan:    Problem List Items Addressed This Visit      Cardiovascular and Mediastinum   Essential hypertension - Primary    New problem, but blood pressure has been elevated for at least the last year She was just started on medication 4 days ago by gynecology Her blood pressure has already improved somewhat, but is not to goal today Recent BMP reviewed with normal creatinine Continue amlodipine 5 mg daily Follow-up in 6 to 8 weeks and consider dose titration Screening lipid panel and LFTs today Advised no estrogen-containing birth control until blood pressure is well controlled      Relevant Medications   amLODipine (NORVASC) 5 MG tablet   Other Relevant Orders   Lipid Profile   Hepatic function panel     Other   BRCA negative    Followed by gynecology with regular screening mammograms, but she does have the risk for breast cancer that is similar to the general population as she is BRCA negative      Obesity    Discussed importance of healthy weight management, diet, exercise      Relevant Orders   Lipid Profile   Hepatic function panel   Family history of breast cancer    As above,  followed by gynecology with regular screening mammograms Patient is at population level risk  for breast cancer despite this family history as she is BRCA negative      GAD (generalized anxiety disorder)    New diagnosis Mild Discussed importance of therapy Gave patient the option of whether or not to take medication, which she would like to try We will start Celexa 20 mg daily Discussed it can take 6 to 8 weeks to reach full efficacy Discussed potential side effects including possible increased SI risk, sexual dysfunction, and GI upset Follow-up in 6 to 8 weeks and consider dose titration Repeat GAD-7 and PHQ-9 at next visit      Relevant Medications   citalopram (CELEXA) 20 MG tablet       Return in about 2 months (around 02/03/2018) for GAD and BP f/u.  Reviewed gynecology records in epic.  Also reviewed recent imaging and lab work.  No previous PCP to request records from.  The entirety of the information documented in the History of Present Illness, Review of Systems and Physical Exam were personally obtained by me. Portions of this information were initially documented by Raquel Sarna Ratchford, CMA and reviewed by me for thoroughness and accuracy.    Virginia Crews, MD, MPH [facility] 12/03/2017 12:09 PM

## 2017-12-03 NOTE — Assessment & Plan Note (Signed)
Followed by gynecology with regular screening mammograms, but she does have the risk for breast cancer that is similar to the general population as she is BRCA negative

## 2017-12-03 NOTE — Assessment & Plan Note (Signed)
New diagnosis Mild Discussed importance of therapy Gave patient the option of whether or not to take medication, which she would like to try We will start Celexa 20 mg daily Discussed it can take 6 to 8 weeks to reach full efficacy Discussed potential side effects including possible increased SI risk, sexual dysfunction, and GI upset Follow-up in 6 to 8 weeks and consider dose titration Repeat GAD-7 and PHQ-9 at next visit

## 2017-12-04 ENCOUNTER — Telehealth: Payer: Self-pay

## 2017-12-04 ENCOUNTER — Encounter (INDEPENDENT_AMBULATORY_CARE_PROVIDER_SITE_OTHER): Payer: Self-pay

## 2017-12-04 LAB — HEPATIC FUNCTION PANEL
ALBUMIN: 4.3 g/dL (ref 3.5–5.5)
ALT: 10 IU/L (ref 0–32)
AST: 12 IU/L (ref 0–40)
Alkaline Phosphatase: 66 IU/L (ref 39–117)
BILIRUBIN TOTAL: 0.5 mg/dL (ref 0.0–1.2)
BILIRUBIN, DIRECT: 0.11 mg/dL (ref 0.00–0.40)
Total Protein: 6.7 g/dL (ref 6.0–8.5)

## 2017-12-04 LAB — LIPID PANEL
CHOL/HDL RATIO: 4.9 ratio — AB (ref 0.0–4.4)
CHOLESTEROL TOTAL: 228 mg/dL — AB (ref 100–199)
HDL: 47 mg/dL (ref >39)
LDL Calculated: 149 mg/dL — ABNORMAL HIGH (ref 0–99)
Triglycerides: 159 mg/dL — ABNORMAL HIGH (ref 0–149)
VLDL Cholesterol Cal: 32 mg/dL (ref 5–40)

## 2017-12-04 NOTE — Telephone Encounter (Signed)
-----   Message from Erasmo DownerAngela M Bacigalupo, MD sent at 12/04/2017  1:36 PM EDT ----- Cholesterol is high, but 10 year risk of heart disease/stroke is low at 1.7%.  No need for medication.  Recommend diet low in saturated fats and regular exercise-30 minutes a day at least 5 times a week.  Normal liver function test.  Erasmo DownerBacigalupo, Angela M, MD, MPH [facility] 12/04/2017 1:36 PM

## 2017-12-04 NOTE — Telephone Encounter (Signed)
Left message advising pt. OK per DPR. 

## 2017-12-06 ENCOUNTER — Ambulatory Visit: Payer: BC Managed Care – PPO | Admitting: Family Medicine

## 2017-12-16 ENCOUNTER — Ambulatory Visit: Payer: BC Managed Care – PPO | Admitting: Certified Nurse Midwife

## 2017-12-20 ENCOUNTER — Ambulatory Visit
Admission: RE | Admit: 2017-12-20 | Discharge: 2017-12-20 | Disposition: A | Discharge status: Home / Self Care | Payer: BC Managed Care – PPO | Source: Ambulatory Visit | Attending: Certified Nurse Midwife | Admitting: Certified Nurse Midwife

## 2017-12-20 DIAGNOSIS — Z1231 Encounter for screening mammogram for malignant neoplasm of breast: Secondary | ICD-10-CM | POA: Insufficient documentation

## 2017-12-20 DIAGNOSIS — Z1239 Encounter for other screening for malignant neoplasm of breast: Secondary | ICD-10-CM

## 2018-02-07 ENCOUNTER — Ambulatory Visit: Payer: BC Managed Care – PPO | Admitting: Family Medicine

## 2018-02-25 ENCOUNTER — Ambulatory Visit (INDEPENDENT_AMBULATORY_CARE_PROVIDER_SITE_OTHER): Payer: BC Managed Care – PPO | Admitting: Family Medicine

## 2018-02-25 ENCOUNTER — Encounter: Payer: Self-pay | Admitting: Family Medicine

## 2018-02-25 VITALS — BP 130/80 | HR 74 | Temp 98.4°F | Wt 264.6 lb

## 2018-02-25 DIAGNOSIS — F411 Generalized anxiety disorder: Secondary | ICD-10-CM

## 2018-02-25 DIAGNOSIS — Z30017 Encounter for initial prescription of implantable subdermal contraceptive: Secondary | ICD-10-CM

## 2018-02-25 DIAGNOSIS — I10 Essential (primary) hypertension: Secondary | ICD-10-CM | POA: Diagnosis not present

## 2018-02-25 DIAGNOSIS — Z6841 Body Mass Index (BMI) 40.0 and over, adult: Secondary | ICD-10-CM

## 2018-02-25 DIAGNOSIS — Z3046 Encounter for surveillance of implantable subdermal contraceptive: Secondary | ICD-10-CM | POA: Insufficient documentation

## 2018-02-25 MED ORDER — CITALOPRAM HYDROBROMIDE 20 MG PO TABS: 20.0000 mg | ORAL_TABLET | Freq: Every day | ORAL | 3 refills | Status: DC

## 2018-02-25 MED ORDER — AMLODIPINE BESYLATE 5 MG PO TABS: 5.0000 mg | ORAL_TABLET | Freq: Every day | ORAL | 3 refills | Status: DC

## 2018-02-25 MED ORDER — ETONOGESTREL 68 MG ~~LOC~~ IMPL
68.0000 mg | DRUG_IMPLANT | Freq: Once | SUBCUTANEOUS | Status: AC
  Administered 2018-02-25: 68 mg via SUBCUTANEOUS

## 2018-02-25 NOTE — Patient Instructions (Signed)
Nexplanon Instructions After Insertion   Keep bandage clean and dry for 24 hours   May use ice/Tylenol/Ibuprofen for soreness or pain   If you develop fever, drainage or increased warmth from incision site-contact office immediately  Etonogestrel implant What is this medicine? ETONOGESTREL (et oh noe JES trel) is a contraceptive (birth control) device. It is used to prevent pregnancy. It can be used for up to 3 years. This medicine may be used for other purposes; ask your health care provider or pharmacist if you have questions. COMMON BRAND NAME(S): Implanon, Nexplanon What should I tell my health care provider before I take this medicine? They need to know if you have any of these conditions: -abnormal vaginal bleeding -blood vessel disease or blood clots -cancer of the breast, cervix, or liver -depression -diabetes -gallbladder disease -headaches -heart disease or recent heart attack -high blood pressure -high cholesterol -kidney disease -liver disease -renal disease -seizures -tobacco smoker -an unusual or allergic reaction to etonogestrel, other hormones, anesthetics or antiseptics, medicines, foods, dyes, or preservatives -pregnant or trying to get pregnant -breast-feeding How should I use this medicine? This device is inserted just under the skin on the inner side of your upper arm by a health care professional. Talk to your pediatrician regarding the use of this medicine in children. Special care may be needed. Overdosage: If you think you have taken too much of this medicine contact a poison control center or emergency room at once. NOTE: This medicine is only for you. Do not share this medicine with others. What if I miss a dose? This does not apply. What may interact with this medicine? Do not take this medicine with any of the following medications: -amprenavir -bosentan -fosamprenavir This medicine may also interact with the following  medications: -barbiturate medicines for inducing sleep or treating seizures -certain medicines for fungal infections like ketoconazole and itraconazole -grapefruit juice -griseofulvin -medicines to treat seizures like carbamazepine, felbamate, oxcarbazepine, phenytoin, topiramate -modafinil -phenylbutazone -rifampin -rufinamide -some medicines to treat HIV infection like atazanavir, indinavir, lopinavir, nelfinavir, tipranavir, ritonavir -St. John's wort This list may not describe all possible interactions. Give your health care provider a list of all the medicines, herbs, non-prescription drugs, or dietary supplements you use. Also tell them if you smoke, drink alcohol, or use illegal drugs. Some items may interact with your medicine. What should I watch for while using this medicine? This product does not protect you against HIV infection (AIDS) or other sexually transmitted diseases. You should be able to feel the implant by pressing your fingertips over the skin where it was inserted. Contact your doctor if you cannot feel the implant, and use a non-hormonal birth control method (such as condoms) until your doctor confirms that the implant is in place. If you feel that the implant may have broken or become bent while in your arm, contact your healthcare provider. What side effects may I notice from receiving this medicine? Side effects that you should report to your doctor or health care professional as soon as possible: -allergic reactions like skin rash, itching or hives, swelling of the face, lips, or tongue -breast lumps -changes in emotions or moods -depressed mood -heavy or prolonged menstrual bleeding -pain, irritation, swelling, or bruising at the insertion site -scar at site of insertion -signs of infection at the insertion site such as fever, and skin redness, pain or discharge -signs of pregnancy -signs and symptoms of a blood clot such as breathing problems; changes in  vision; chest pain; severe,   sudden headache; pain, swelling, warmth in the leg; trouble speaking; sudden numbness or weakness of the face, arm or leg -signs and symptoms of liver injury like dark yellow or brown urine; general ill feeling or flu-like symptoms; light-colored stools; loss of appetite; nausea; right upper belly pain; unusually weak or tired; yellowing of the eyes or skin -unusual vaginal bleeding, discharge -signs and symptoms of a stroke like changes in vision; confusion; trouble speaking or understanding; severe headaches; sudden numbness or weakness of the face, arm or leg; trouble walking; dizziness; loss of balance or coordination Side effects that usually do not require medical attention (report to your doctor or health care professional if they continue or are bothersome): -acne -back pain -breast pain -changes in weight -dizziness -general ill feeling or flu-like symptoms -headache -irregular menstrual bleeding -nausea -sore throat -vaginal irritation or inflammation This list may not describe all possible side effects. Call your doctor for medical advice about side effects. You may report side effects to FDA at 1-800-FDA-1088. Where should I keep my medicine? This drug is given in a hospital or clinic and will not be stored at home. NOTE: This sheet is a summary. It may not cover all possible information. If you have questions about this medicine, talk to your doctor, pharmacist, or health care provider.  2018 Elsevier/Gold Standard (2015-11-24 11:19:22)  

## 2018-02-25 NOTE — Progress Notes (Signed)
Patient: Elizabeth Stevens Female    DOB: [birth year]   37 y.o.   MRN: [id number] Visit Date: 02/27/2018  Today's Provider: Shirlee Latch, MD   Chief Complaint  Patient presents with  . Hypertension   Subjective:    HPI   Patient is here today for 2 month follow up for blood pressure. Patient states the Amlodipine 5 mg.  Medication is working. Patient states she is feeling a lot better.  She has not been taking her blood pressure at home.  She feels like she is eating a low-sodium diet.  Denies any SOB, CP, vision changes, LE edema, medication SEs, or symptoms of hypotension  Anxiety: This was a new diagnosis at last visit on 12/03/2017.  She was started on Celexa 20 mg daily.  She has been taking this with good compliance and denies any side effects.  She feels like her anxiety is much more well controlled.  She denies any depression or SI.  She states that her husband and family have even noticed that she is more like herself now  Contraception: Patient was previously taking combined OCPs, but these were stopped when she was diagnosed with hypertension.  She has been taking progesterone only pills since her hypertension diagnosis.  She wonders if she could have a different form of contraception.  She worries about having to take her medication at the exact same time every day.  She previously had an IUD and really liked it and had much lighter periods.  Her husband did get poked by the strings, however, so she is not interested in another IUD.  She may be interested in a Nexplanon or combined OCPs.   No Known Allergies   Current Outpatient Medications:  .  amLODipine (NORVASC) 5 MG tablet, Take 1 tablet (5 mg total) by mouth daily., Disp: 90 tablet, Rfl: 3 .  citalopram (CELEXA) 20 MG tablet, Take 1 tablet (20 mg total) by mouth daily., Disp: 90 tablet, Rfl: 3 .  Multiple Vitamin (MULTIVITAMIN) tablet, Take 1 tablet by mouth daily., Disp: , Rfl:   Review of Systems    Constitutional: Negative.   HENT: Negative.   Respiratory: Negative.   Cardiovascular: Negative.   Gastrointestinal: Negative.   Genitourinary: Negative.   Musculoskeletal: Negative.   Neurological: Negative.   Psychiatric/Behavioral: Negative.     Social History   Tobacco Use  . Smoking status: Former Smoker    Types: Cigarettes    Last attempt to quit: 05/21/2005    Years since quitting: 12.7  . Smokeless tobacco: Never Used  . Tobacco comment: former social smoker  Substance Use Topics  . Alcohol use: Yes    Comment: drinks wine about 2 times per month   Objective:   BP 130/80 (BP Location: Right Arm, Patient Position: Sitting, Cuff Size: Large)   Pulse 74   Temp 98.4 F (36.9 C) (Oral)   Wt 264 lb 9.6 oz (120 kg)   SpO2 99%   BMI 40.23 kg/m  Vitals:   02/25/18 0954  BP: 130/80  Pulse: 74  Temp: 98.4 F (36.9 C)  TempSrc: Oral  SpO2: 99%  Weight: 264 lb 9.6 oz (120 kg)     Physical Exam  Constitutional: She is oriented to person, place, and time. She appears well-developed and well-nourished. No distress.  HENT:  Head: Normocephalic and atraumatic.  Eyes: Conjunctivae are normal. No scleral icterus.  Neck: Neck supple. No thyromegaly present.  Cardiovascular: Normal rate, regular rhythm,  normal heart sounds and intact distal pulses.  No murmur heard. Pulmonary/Chest: Effort normal and breath sounds normal. No respiratory distress. She has no wheezes. She has no rales.  Musculoskeletal: She exhibits no edema.  Lymphadenopathy:    She has no cervical adenopathy.  Neurological: She is alert and oriented to person, place, and time.  Skin: Skin is warm and dry. Capillary refill takes less than 2 seconds. No rash noted.  Psychiatric: She has a normal mood and affect. Her behavior is normal.  Vitals reviewed.       Assessment & Plan:   Problem List Items Addressed This Visit      Cardiovascular and Mediastinum   Essential hypertension - Primary     Well-controlled Continue amlodipine 5 mg daily Reviewed recent labs As blood pressure is well controlled, will be can consider estrogen-containing birth control if she wants      Relevant Medications   amLODipine (NORVASC) 5 MG tablet     Other   Obesity    Discussed importance of healthy weight management, diet, and exercise      GAD (generalized anxiety disorder)    Well-controlled Continue Celexa 20 mg daily Discussed that it can take 6 to 8 weeks to reach full efficacy, so she may continue to get more improvement She is asymptomatic without any side effects Discussed importance of therapy      Relevant Medications   citalopram (CELEXA) 20 MG tablet   Encounter for counseling regarding contraception    Long discussion with the patient regarding options for contraception As her blood pressure is well controlled, she would be a fine candidate for estrogen-containing birth control Patient decides, however, instead to go with a long-acting reversible contraception in the form of Nexplanon Nexplanon was placed during today's visit Discussed return precautions including signs of infection      Relevant Medications   etonogestrel (NEXPLANON) implant 68 mg (Completed)       PROCEDURE NOTE: NEXPLANON  INSERTION  Patient given informed consent and signed copy in the chart for both procedures. An appropriate time out was been taken  L  arm area prepped and draped in the usual sterile fashion. Three cc of 1% lidocaine without epinephrine used for local anesthesia. Nexplanon was inserted in typical fashion. There were no complications.  Pressure bandage was  applied to decrease bruising. Patient given follow up instructions should she experience redness, swelling at sight or fever in the next 24 hours. Patient given Nexplanon pocket card.    Return in about 6 months (around 08/27/2018) for BP f/u.   The entirety of the information documented in the History of Present Illness,  Review of Systems and Physical Exam were personally obtained by me. Portions of this information were initially documented by Kavin Leech and Dara Lords, CMA and reviewed by me for thoroughness and accuracy.    Erasmo Downer, MD, MPH [facility] 02/27/2018 1:05 PM

## 2018-02-27 NOTE — Assessment & Plan Note (Signed)
Well-controlled Continue amlodipine 5 mg daily Reviewed recent labs As blood pressure is well controlled, will be can consider estrogen-containing birth control if she wants

## 2018-02-27 NOTE — Assessment & Plan Note (Signed)
Well-controlled Continue Celexa 20 mg daily Discussed that it can take 6 to 8 weeks to reach full efficacy, so she may continue to get more improvement She is asymptomatic without any side effects Discussed importance of therapy

## 2018-02-27 NOTE — Assessment & Plan Note (Signed)
Discussed importance of healthy weight management, diet, and exercise 

## 2018-02-27 NOTE — Assessment & Plan Note (Signed)
Long discussion with the patient regarding options for contraception As her blood pressure is well controlled, she would be a fine candidate for estrogen-containing birth control Patient decides, however, instead to go with a long-acting reversible contraception in the form of Nexplanon Nexplanon was placed during today's visit Discussed return precautions including signs of infection

## 2018-02-28 ENCOUNTER — Ambulatory Visit: Payer: Self-pay | Admitting: Family Medicine

## 2018-04-12 ENCOUNTER — Other Ambulatory Visit: Payer: Self-pay | Admitting: Family Medicine

## 2018-04-13 ENCOUNTER — Other Ambulatory Visit: Payer: Self-pay | Admitting: Family Medicine

## 2018-04-15 ENCOUNTER — Ambulatory Visit: Payer: BC Managed Care – PPO | Admitting: Physician Assistant

## 2018-04-15 ENCOUNTER — Encounter: Payer: Self-pay | Admitting: Physician Assistant

## 2018-04-15 VITALS — BP 140/83 | HR 79 | Temp 97.7°F | Wt 265.9 lb

## 2018-04-15 DIAGNOSIS — S39012A Strain of muscle, fascia and tendon of lower back, initial encounter: Secondary | ICD-10-CM | POA: Diagnosis not present

## 2018-04-15 DIAGNOSIS — M62838 Other muscle spasm: Secondary | ICD-10-CM

## 2018-04-15 MED ORDER — CYCLOBENZAPRINE HCL 5 MG PO TABS: 5.0000 mg | ORAL_TABLET | Freq: Three times a day (TID) | ORAL | 1 refills | Status: DC | PRN

## 2018-04-15 MED ORDER — PREDNISONE 10 MG (21) PO TBPK: ORAL_TABLET | ORAL | 0 refills | Status: DC

## 2018-04-15 NOTE — Progress Notes (Signed)
Patient: Elizabeth Stevens Female    DOB: [birth year]   37 y.o.   MRN: [id number] Visit Date: 04/15/2018  Today's Provider: Margaretann Loveless, PA-C   Chief Complaint  Patient presents with  . Back Pain   Subjective:    Back Pain  This is a new problem. The current episode started 1 to 4 weeks ago (4 weeks). The pain is present in the lumbar spine. Pertinent negatives include no numbness or weakness.   About 4 weeks ago she was doing some dead lifts and noticed her back started to aggravate her. The next day she was able to run 2.5 miles. Then the next day she was able to do the same, but later that day she felt her back starting to tighten up. She reports the symptoms are worse on the right than the left. No radiating symptoms.     No Known Allergies   Current Outpatient Medications:  .  amLODipine (NORVASC) 5 MG tablet, Take 1 tablet (5 mg total) by mouth daily., Disp: 90 tablet, Rfl: 3 .  citalopram (CELEXA) 20 MG tablet, Take 1 tablet (20 mg total) by mouth daily., Disp: 90 tablet, Rfl: 3 .  Multiple Vitamin (MULTIVITAMIN) tablet, Take 1 tablet by mouth daily., Disp: , Rfl:   Review of Systems  Constitutional: Negative.   Respiratory: Negative.   Cardiovascular: Negative.   Gastrointestinal: Negative.   Musculoskeletal: Positive for back pain.  Neurological: Negative.  Negative for weakness and numbness.    Social History   Tobacco Use  . Smoking status: Former Smoker    Types: Cigarettes    Last attempt to quit: 05/21/2005    Years since quitting: 12.9  . Smokeless tobacco: Never Used  . Tobacco comment: former social smoker  Substance Use Topics  . Alcohol use: Yes    Comment: drinks wine about 2 times per month   Objective:   BP 140/83 (BP Location: Right Arm, Patient Position: Sitting, Cuff Size: Large)   Pulse 79   Temp 97.7 F (36.5 C) (Oral)   Wt 265 lb 14.4 oz (120.6 kg)   LMP 03/21/2018   SpO2 99%   BMI 40.43 kg/m  Vitals:   04/15/18  1600  BP: 140/83  Pulse: 79  Temp: 97.7 F (36.5 C)  TempSrc: Oral  SpO2: 99%  Weight: 265 lb 14.4 oz (120.6 kg)     Physical Exam  Constitutional: She appears well-developed and well-nourished. No distress.  Neck: Normal range of motion. Neck supple.  Cardiovascular: Normal rate, regular rhythm and normal heart sounds. Exam reveals no gallop and no friction rub.  No murmur heard. Pulmonary/Chest: Effort normal and breath sounds normal. No respiratory distress. She has no wheezes. She has no rales.  Musculoskeletal:       Lumbar back: She exhibits tenderness, pain and spasm. She exhibits normal range of motion, no bony tenderness, no swelling, no edema and no deformity.       Back:  Skin: She is not diaphoretic.  Vitals reviewed.       Assessment & Plan:     1. Muscle spasm Worsening despite rest and tylenol. Will give flexeril for noted muscle spasm and prednisone for associated inflammation. Back exercises printed for patient. May use heating pad to area. Call if worsening. - cyclobenzaprine (FLEXERIL) 5 MG tablet; Take 1 tablet (5 mg total) by mouth 3 (three) times daily as needed for muscle spasms.  Dispense: 30 tablet; Refill: 1 - predniSONE (  STERAPRED UNI-PAK 21 TAB) 10 MG (21) TBPK tablet; 6 day taper; take as directed on package instructions  Dispense: 21 tablet; Refill: 0  2. Strain of lumbar region, initial encounter See above medical treatment plan. - cyclobenzaprine (FLEXERIL) 5 MG tablet; Take 1 tablet (5 mg total) by mouth 3 (three) times daily as needed for muscle spasms.  Dispense: 30 tablet; Refill: 1 - predniSONE (STERAPRED UNI-PAK 21 TAB) 10 MG (21) TBPK tablet; 6 day taper; take as directed on package instructions  Dispense: 21 tablet; Refill: 0       Margaretann LovelessJennifer M Ariana Cavenaugh, PA-C  [facility]

## 2018-04-15 NOTE — Patient Instructions (Signed)

## 2018-04-25 ENCOUNTER — Encounter: Payer: Self-pay | Admitting: Physician Assistant

## 2018-05-07 ENCOUNTER — Ambulatory Visit: Payer: BC Managed Care – PPO | Admitting: Family Medicine

## 2018-05-07 ENCOUNTER — Encounter: Payer: Self-pay | Admitting: Family Medicine

## 2018-05-07 VITALS — BP 144/85 | HR 100 | Temp 98.3°F | Ht 67.5 in | Wt 271.6 lb

## 2018-05-07 DIAGNOSIS — I10 Essential (primary) hypertension: Secondary | ICD-10-CM

## 2018-05-07 DIAGNOSIS — N926 Irregular menstruation, unspecified: Secondary | ICD-10-CM | POA: Diagnosis not present

## 2018-05-07 DIAGNOSIS — Z3046 Encounter for surveillance of implantable subdermal contraceptive: Secondary | ICD-10-CM | POA: Diagnosis not present

## 2018-05-07 NOTE — Patient Instructions (Signed)
Nexplanon Instructions After Removal  Keep bandage clean and dry for 24 hours  May use ice/Tylenol/Ibuprofen for soreness or pain  If you develop fever, drainage or increased warmth from incision site-contact office immediately   
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
## 2018-05-28 ENCOUNTER — Encounter: Payer: Self-pay | Admitting: Family Medicine

## 2018-05-28 ENCOUNTER — Ambulatory Visit: Payer: BC Managed Care – PPO | Admitting: Family Medicine

## 2018-05-28 VITALS — BP 134/92 | HR 77 | Temp 98.7°F | Resp 16 | Wt 273.0 lb

## 2018-05-28 DIAGNOSIS — S76312A Strain of muscle, fascia and tendon of the posterior muscle group at thigh level, left thigh, initial encounter: Secondary | ICD-10-CM

## 2018-05-28 DIAGNOSIS — H66003 Acute suppurative otitis media without spontaneous rupture of ear drum, bilateral: Secondary | ICD-10-CM | POA: Diagnosis not present

## 2018-05-28 MED ORDER — MELOXICAM 15 MG PO TABS: 15.0000 mg | ORAL_TABLET | Freq: Every day | ORAL | 0 refills | Status: DC

## 2018-05-28 MED ORDER — AMOXICILLIN-POT CLAVULANATE 875-125 MG PO TABS: 1.0000 | ORAL_TABLET | Freq: Two times a day (BID) | ORAL | 0 refills | Status: AC

## 2018-05-28 NOTE — Patient Instructions (Signed)

## 2018-05-28 NOTE — Progress Notes (Signed)
Patient: Elizabeth Stevens Female    DOB: November 24, 1980   38 y.o.   MRN: [id number] Visit Date: 05/28/2018  Today's Provider: Shirlee Latch, MD   Chief Complaint  Patient presents with  . Sore Throat  . Leg Pain   Subjective:     Sore Throat   This is a new problem. The current episode started 1 to 4 weeks ago. The problem has been unchanged. Neither side of throat is experiencing more pain than the other. There has been no fever. The pain is mild. Associated symptoms include congestion, coughing (productive of mucous), ear pain (popping sound in ear), a hoarse voice (only in AM), shortness of breath (on exertion) and swollen glands. Pertinent negatives include no abdominal pain, diarrhea, drooling, ear discharge, headaches, plugged ear sensation, neck pain, stridor, trouble swallowing or vomiting. 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 She has tried NSAIDs (otc cough suppressant) for the symptoms. The treatment provided moderate (when taking Motrin) relief.  Leg Pain   There was no injury mechanism (pain occured two days ago). The pain is present in the left leg (radiating to left thigh, patient states this morning she had pain in her calf muscle). The quality of the pain is described as aching. The pain has been constant since onset. Associated symptoms include tingling (in feet). Pertinent negatives include no inability to bear weight, loss of motion, loss of sensation, muscle weakness or numbness. Nothing aggravates the symptoms. She has tried NSAIDs (previously prescribed muscle relaxer) for the symptoms. The treatment provided no relief.   Son and husband sick too.   No Known Allergies   Current Outpatient Medications:  .  amLODipine (NORVASC) 5 MG tablet, Take 1 tablet (5 mg total) by mouth daily., Disp: 90 tablet, Rfl: 3 .  citalopram (CELEXA) 20 MG tablet, Take 1 tablet (20 mg total) by mouth daily., Disp:  90 tablet, Rfl: 3 .  Multiple Vitamin (MULTIVITAMIN) tablet, Take 1 tablet by mouth daily., Disp: , Rfl:   Review of Systems  HENT: Positive for congestion, ear pain (popping sound in ear) and hoarse voice (only in AM). Negative for drooling, ear discharge and trouble swallowing.   Respiratory: Positive for cough (productive of mucous) and shortness of breath (on exertion). Negative for stridor.   Gastrointestinal: Negative for abdominal pain, diarrhea and vomiting.  Musculoskeletal: Negative for neck pain.  Neurological: Positive for tingling (in feet). Negative for numbness and headaches.    Social History   Tobacco Use  . Smoking status: Former Smoker    Types: Cigarettes    Last attempt to quit: 05/21/2005    Years since quitting: 13.0  . Smokeless tobacco: Never Used  . Tobacco comment: former social smoker  Substance Use Topics  . Alcohol use: Yes    Comment: drinks wine about 2 times per month      Objective:   BP (!) 134/92   Pulse 77   Temp 98.7 F (37.1 C) (Oral)   Resp 16   Wt 273 lb (123.8 kg)   LMP 05/11/2018   SpO2 97%   BMI 42.13 kg/m  Vitals:   05/28/18 1120  BP: (!) 134/92  Pulse: 77  Resp: 16  Temp: 98.7 F (37.1 C)  TempSrc: Oral  SpO2: 97%  Weight: 273 lb (123.8 kg)     Physical Exam Vitals signs reviewed.  Constitutional:      General:  She is not in acute distress.    Appearance: She is well-developed.  HENT:     Head: Normocephalic and atraumatic.     Right Ear: External ear normal. A middle ear effusion is present. Tympanic membrane is erythematous.     Left Ear: External ear normal. A middle ear effusion is present. Tympanic membrane is erythematous.     Nose: Congestion and rhinorrhea present.     Mouth/Throat:     Mouth: Mucous membranes are moist. No oral lesions.     Pharynx: Oropharynx is clear. No oropharyngeal exudate or uvula swelling.     Tonsils: No tonsillar exudate or tonsillar abscesses.  Eyes:     Conjunctiva/sclera:  Conjunctivae normal.     Pupils: Pupils are equal, round, and reactive to light.  Neck:     Musculoskeletal: Neck supple.     Thyroid: No thyromegaly.  Cardiovascular:     Rate and Rhythm: Normal rate and regular rhythm.     Heart sounds: Normal heart sounds. No murmur.  Pulmonary:     Effort: Pulmonary effort is normal. No respiratory distress.     Breath sounds: Normal breath sounds. No wheezing or rhonchi.  Abdominal:     General: There is no distension.     Palpations: Abdomen is soft.     Tenderness: There is no abdominal tenderness.  Musculoskeletal:     Comments: L leg: Normal ROM.  Normal strength, no TTP.  Lymphadenopathy:     Cervical: No cervical adenopathy.  Skin:    General: Skin is warm and dry.     Capillary Refill: Capillary refill takes less than 2 seconds.     Findings: No rash.  Neurological:     General: No focal deficit present.     Mental Status: She is alert and oriented to person, place, and time.  Psychiatric:        Mood and Affect: Mood normal.        Behavior: Behavior normal.       Assessment & Plan   1. Hamstring strain, left, initial encounter - Leg pain consistent with hamstring strain with normal exam - discussed ice, NSAIDs, rest and stretching - return precautions discussed  2. Non-recurrent acute suppurative otitis media of both ears without spontaneous rupture of tympanic membranes - symptoms and exam c/w viral URI but also with AOM - no evidence of strep pharyngitis, CAP, bacterial sinusitis, or other bacterial infection - discussed symptomatic management, natural course, and return precautions - will treat AOM with Augmentin x7d     Meds ordered this encounter  Medications  . meloxicam (MOBIC) 15 MG tablet    Sig: Take 1 tablet (15 mg total) by mouth daily.    Dispense:  30 tablet    Refill:  0  . amoxicillin-clavulanate (AUGMENTIN) 875-125 MG tablet    Sig: Take 1 tablet by mouth 2 (two) times daily for 7 days.     Dispense:  14 tablet    Refill:  0     Return if symptoms worsen or fail to improve.   The entirety of the information documented in the History of Present Illness, Review of Systems and Physical Exam were personally obtained by me. Portions of this information were initially documented by Nadene Rubins, CMA and reviewed by me for thoroughness and accuracy.    Erasmo Downer, MD, MPH [facility] 05/28/2018 11:46 AM

## 2018-08-28 ENCOUNTER — Ambulatory Visit: Payer: BC Managed Care – PPO | Admitting: Family Medicine

## 2018-09-01 ENCOUNTER — Encounter: Payer: Self-pay | Admitting: Family Medicine

## 2018-09-01 ENCOUNTER — Ambulatory Visit (INDEPENDENT_AMBULATORY_CARE_PROVIDER_SITE_OTHER): Payer: BC Managed Care – PPO | Admitting: Family Medicine

## 2018-09-01 VITALS — BP 129/84 | HR 74

## 2018-09-01 DIAGNOSIS — I10 Essential (primary) hypertension: Secondary | ICD-10-CM

## 2018-09-01 NOTE — Progress Notes (Addendum)
Patient: Elizabeth Stevens Female    DOB: 04/15/1981   38 y.o.   MRN: [id number] Visit Date: 09/01/2018  Today's Provider: Shirlee LatchAngela , MD   Chief Complaint  Patient presents with  . Hypertension   Subjective:    Virtual Visit via Video Note  I connected with Macarthur CritchleyHeather L Cristo on 09/01/18 at 11:00 AM EDT by a video enabled telemedicine application and verified that I am speaking with the correct person using two identifiers.   I discussed the limitations of evaluation and management by telemedicine and the availability of in person appointments. The patient expressed understanding and agreed to proceed.   Patient location: home Provider location: home office Persons involved in the visit: patient, provider    HPI  Hypertension, follow-up:  BP Readings from Last 3 Encounters:  09/01/18 129/84  05/28/18 (!) 134/92  05/07/18 (!) 144/85    She was last seen for hypertension 4 months ago.  BP at that visit was 144/85. Management changes since that visit include advised to stop Estrogen. She reports good compliance with treatment. She is not having side effects.  She is exercising. She is adherent to low salt diet.   Outside blood pressures are not being checked at home. She is experiencing none.  Patient denies chest pain, chest pressure/discomfort, claudication, dyspnea, exertional chest pressure/discomfort, fatigue, irregular heart beat, lower extremity edema, near-syncope, orthopnea, palpitations, paroxysmal nocturnal dyspnea, syncope and tachypnea.   Cardiovascular risk factors include hypertension.  Use of agents associated with hypertension: none.     Weight trend: stable Wt Readings from Last 3 Encounters:  05/28/18 273 lb (123.8 kg)  05/07/18 271 lb 9.6 oz (123.2 kg)  04/15/18 265 lb 14.4 oz (120.6 kg)    ------------------------------------------------------------------------  No Known Allergies   Current Outpatient Medications:  .   amLODipine (NORVASC) 5 MG tablet, Take 1 tablet (5 mg total) by mouth daily., Disp: 90 tablet, Rfl: 3 .  citalopram (CELEXA) 20 MG tablet, Take 1 tablet (20 mg total) by mouth daily., Disp: 90 tablet, Rfl: 3 .  meloxicam (MOBIC) 15 MG tablet, Take 1 tablet (15 mg total) by mouth daily., Disp: 30 tablet, Rfl: 0 .  Multiple Vitamin (MULTIVITAMIN) tablet, Take 1 tablet by mouth daily., Disp: , Rfl:  .  NORLYDA 0.35 MG tablet, , Disp: , Rfl:   Review of Systems  Constitutional: Negative.   Respiratory: Negative.   Cardiovascular: Negative.   Musculoskeletal: Negative.     Social History   Tobacco Use  . Smoking status: Former Smoker    Types: Cigarettes    Last attempt to quit: 05/21/2005    Years since quitting: 13.2  . Smokeless tobacco: Never Used  . Tobacco comment: former social smoker  Substance Use Topics  . Alcohol use: Yes    Comment: drinks wine about 2 times per month      Objective:   BP 129/84 (BP Location: Left Arm, Patient Position: Sitting, Cuff Size: Large)   Pulse 74  Vitals:   09/01/18 1001  BP: 129/84  Pulse: 74   Patient had vitals taken from her car this AM at our office  Physical Exam Vitals signs reviewed.  Constitutional:      Appearance: Normal appearance.  Pulmonary:     Effort: Pulmonary effort is normal. No respiratory distress.  Neurological:     Mental Status: She is alert and oriented to person, place, and time. Mental status is at baseline.  Psychiatric:  Mood and Affect: Mood normal.        Behavior: Behavior normal.         Assessment & Plan      I discussed the assessment and treatment plan with the patient. The patient was provided an opportunity to ask questions and all were answered. The patient agreed with the plan and demonstrated an understanding of the instructions.   The patient was advised to call back or seek an in-person evaluation if the symptoms worsen or if the condition fails to improve as anticipated.   Problem List Items Addressed This Visit      Cardiovascular and Mediastinum   Essential hypertension - Primary    Well controlled Continue amlodipine 5mg  daily Labs at next visit F/u in 68months          Return in about 6 months (around 03/03/2019) for Physical.   The entirety of the information documented in the History of Present Illness, Review of Systems and Physical Exam were personally obtained by me. Portions of this information were initially documented by Presley Raddle, CMA and reviewed by me for thoroughness and accuracy.    Erasmo Downer, MD, MPH [facility] 09/01/2018 11:14 AM

## 2018-09-01 NOTE — Assessment & Plan Note (Signed)
Well controlled Continue amlodipine 5mg  daily Labs at next visit F/u in 11months

## 2018-09-02 ENCOUNTER — Ambulatory Visit: Payer: BC Managed Care – PPO | Admitting: Family Medicine

## 2018-09-15 ENCOUNTER — Encounter: Payer: Self-pay | Admitting: Family Medicine

## 2018-09-17 ENCOUNTER — Ambulatory Visit (INDEPENDENT_AMBULATORY_CARE_PROVIDER_SITE_OTHER): Payer: BC Managed Care – PPO | Admitting: Family Medicine

## 2018-09-17 ENCOUNTER — Encounter: Payer: Self-pay | Admitting: Family Medicine

## 2018-09-17 DIAGNOSIS — N643 Galactorrhea not associated with childbirth: Secondary | ICD-10-CM

## 2018-09-17 NOTE — Progress Notes (Signed)
Patient: Elizabeth Stevens Female    DOB: [birth year]   38 y.o.   MRN: [id number] Visit Date: 09/18/2018  Today's Provider: Shirlee Latch, MD   Chief Complaint  Patient presents with  . Galactorrhea   Subjective:    Virtual Visit via Video Note  I connected with [name] on 09/18/18 at  1:40 PM EDT by a video enabled telemedicine application and verified that I am speaking with the correct person using two identifiers.   I discussed the limitations of evaluation and management by telemedicine and the availability of in person appointments. The patient expressed understanding and agreed to proceed.   Patient location: home Provider location: [facility] Persons involved in the visit: patient, provider    HPI  5 days ago patient was laying prone on her bed with some breast compression.  When she sat up she noticed that her shirt was wet on both sides as if she had had some galactorrhea bilaterally.  She tried to express and was able to express a small amount of clear, nonbloody discharge from her right breast.  She has not been able to express anything from either breast since that time.  She cannot feel any masses in either breast or axilla.  She had this happen in the past during pregnancy, so she took 2 home pregnancy test that were both negative.  Her youngest child is 71 years old, so it has been about 5 years since she breast-fed last.  She is taking Celexa, but no antipsychotics.  She has had no recent changes in medications.  She is on progesterone only pills for contraception.  No Known Allergies   Current Outpatient Medications:  .  amLODipine (NORVASC) 5 MG tablet, Take 1 tablet (5 mg total) by mouth daily., Disp: 90 tablet, Rfl: 3 .  citalopram (CELEXA) 20 MG tablet, Take 1 tablet (20 mg total) by mouth daily., Disp: 90 tablet, Rfl: 3 .  meloxicam (MOBIC) 15 MG tablet, Take 1 tablet (15 mg total) by mouth daily., Disp: 30 tablet, Rfl: 0 .   Multiple Vitamin (MULTIVITAMIN) tablet, Take 1 tablet by mouth daily., Disp: , Rfl:  .  NORLYDA 0.35 MG tablet, , Disp: , Rfl:   Review of Systems  Constitutional: Negative.   Respiratory: Negative.   Cardiovascular: Negative.   Gastrointestinal: Negative.   Skin: Negative.   Neurological: Negative.   Psychiatric/Behavioral: Negative.     Social History   Tobacco Use  . Smoking status: Former Smoker    Types: Cigarettes    Last attempt to quit: 05/21/2005    Years since quitting: 13.3  . Smokeless tobacco: Never Used  . Tobacco comment: former social smoker  Substance Use Topics  . Alcohol use: Yes    Comment: drinks wine about 2 times per month      Objective:   There were no vitals taken for this visit. There were no vitals filed for this visit.   Physical Exam Constitutional:      Appearance: Normal appearance.  Pulmonary:     Effort: Pulmonary effort is normal. No respiratory distress.  Neurological:     Mental Status: She is alert and oriented to person, place, and time. Mental status is at baseline.  Psychiatric:        Mood and Affect: Mood normal.        Behavior: Behavior normal.         Assessment & Plan    I discussed the  assessment and treatment plan with the patient. The patient was provided an opportunity to ask questions and all were answered. The patient agreed with the plan and demonstrated an understanding of the instructions.   The patient was advised to call back or seek an in-person evaluation if the symptoms worsen or if the condition fails to improve as anticipated.  Problem List Items Addressed This Visit      Other   Galactorrhea - Primary    New problem Self-limited Seems to have been bilateral, but then was only able to express unilaterally Reassured her that it being bilateral, not spontaneous, clear nonbloody, and not associated with any masses is reassuring This is likely physiologic galactorrhea, but we will check TSH,  prolactin, hCG, and renal function to ensure no secondary cause There is a small risk of this with Celexa, but she has been stable on this, so we will not discontinue at this time As she has a family history of early breast cancer, we will also go ahead and get her a diagnostic mammogram and ultrasound bilaterally Discussed return precautions      Relevant Orders   hCG, serum, qualitative   TSH   Prolactin   Renal Function Panel   US BREAST COMPLETE UNI RIGHT INC AXILLA   US BREAST COMPLETE UNI LEFT INC AXILLA   MM DIAG BREAST TOMO BILATERAL       Return if symptoms worsen or fail to improve.   The entirety of the information documented in the History of Present Illness, Review of Systems and Physical Exam were personally obtained by me. Portions of this information were initially documented by Presley RaddleNikki Walston, CMA and reviewed by me for thoroughness and accuracy.    Erasmo DownerBacigalupo, Brightyn Mozer M, MD, MPH [facility] 09/18/2018 10:23 AM

## 2018-09-18 DIAGNOSIS — N643 Galactorrhea not associated with childbirth: Secondary | ICD-10-CM | POA: Insufficient documentation

## 2018-09-18 NOTE — Assessment & Plan Note (Addendum)
New problem Self-limited Seems to have been bilateral, but then was only able to express unilaterally Reassured her that it being bilateral, not spontaneous, clear nonbloody, and not associated with any masses is reassuring This is likely physiologic galactorrhea, but we will check TSH, prolactin, hCG, and renal function to ensure no secondary cause There is a small risk of this with Celexa, but she has been stable on this, so we will not discontinue at this time As she has a family history of early breast cancer, we will also go ahead and get her a diagnostic mammogram and ultrasound bilaterally Discussed return precautions

## 2018-09-18 NOTE — Patient Instructions (Signed)
Galactorrhea  Galactorrhea is an abnormal milky discharge from the breast. The discharge may come from one or both nipples. The fluid is often white, yellow, or green. It is different from the normal milk produced in nursing mothers.  Galactorrhea usually occurs in women, but it can sometimes affect men. Various things can cause galactorrhea, such as:  · Irritation of the breast, which can result from injury, stimulation during sexual activity, or clothes rubbing against the nipple.  · Medicines.  · Changes in hormone levels.  In many cases, galactorrhea will go away without treatment. However, galactorrhea can also be a sign of something more serious, such as diseases of the kidney or thyroid, or problems with the pituitary gland. Your health care provider may do various tests to help determine the cause. Sometimes the cause is unknown. It is important to monitor your condition to make sure that it goes away.  Follow these instructions at home:    Breast care  · Watch your condition for any changes.  · Do not squeeze your breasts or nipples.  · Avoid breast stimulation during sexual activity.  · Perform a breast self-exam once a month. Doing this more often can irritate your breasts.  · Avoid clothes that rub on your nipples.  · Use breast pads to absorb the discharge.  · Wear a breast binder or a support bra to help prevent clothes from rubbing on your nipples.  General instructions  · Take over-the-counter and prescription medicines only as told by your health care provider.  · Keep all follow-up visits as told by your health care provider. This is important.  Contact a health care provider if you:  · Develop hot flashes, vaginal dryness, or a lack of sexual desire.  · Stop having menstrual periods, or they are irregular or far apart.  · Have headaches.  · Have vision problems.  Get help right away if you:  · Have breast discharge that is bloody or pus-like.  · Have breast pain.  · Feel a lump in your  breast.  · Have wrinkling or dimpling on your breast.  · Notice that your breast becomes red and swollen.  Summary  · Galactorrhea is an abnormal milky discharge from the breast. The fluid may come from one or both nipples and is often white, yellow, or green.  · Galactorrhea may be caused by various things, such as irritation of the nipples, medicines, or changes in hormone levels.  · Galactorrhea often goes away without treatment. However, it also may be a sign of something more serious, such as diseases of the kidney or thyroid, or problems with the pituitary gland.  · Get help right away if you have discharge that is bloody or pus-like, if you have breast pain or a lump, or if you have skin changes on your breast.  This information is not intended to replace advice given to you by your health care provider. Make sure you discuss any questions you have with your health care provider.  Document Released: 06/14/2004 Document Revised: 04/03/2017 Document Reviewed: 04/03/2017  Elsevier Interactive Patient Education © 2019 [employer]vier Inc[employer]

## 2018-09-19 LAB — RENAL FUNCTION PANEL
Albumin: 4.3 g/dL (ref 3.8–4.8)
BUN/Creatinine Ratio: 17 (ref 9–23)
BUN: 12 mg/dL (ref 6–20)
CO2: 24 mmol/L (ref 20–29)
Calcium: 9.4 mg/dL (ref 8.7–10.2)
Chloride: 105 mmol/L (ref 96–106)
Creatinine, Ser: 0.71 mg/dL (ref 0.57–1.00)
GFR calc Af Amer: 124 mL/min/{1.73_m2} (ref >59)
GFR calc non Af Amer: 108 mL/min/{1.73_m2} (ref >59)
Glucose: 62 mg/dL — ABNORMAL LOW (ref 65–99)
Phosphorus: 2.6 mg/dL — ABNORMAL LOW (ref 3.0–4.3)
Potassium: 4.3 mmol/L (ref 3.5–5.2)
Sodium: 140 mmol/L (ref 134–144)

## 2018-09-19 LAB — PROLACTIN: Prolactin: 14.7 ng/mL (ref 4.8–23.3)

## 2018-09-19 LAB — TSH: TSH: 2.63 u[IU]/mL (ref 0.450–4.500)

## 2018-09-19 LAB — HCG, SERUM, QUALITATIVE: hCG,Beta Subunit,Qual,Serum: NEGATIVE m[IU]/mL (ref <6)

## 2018-09-30 ENCOUNTER — Telehealth: Payer: Self-pay | Admitting: Family Medicine

## 2018-09-30 DIAGNOSIS — N643 Galactorrhea not associated with childbirth: Secondary | ICD-10-CM

## 2018-09-30 NOTE — Telephone Encounter (Signed)
Can you change breast ultrasounds to LIMITED instead of complete per [facility],Thanks

## 2018-10-01 ENCOUNTER — Encounter: Payer: Self-pay | Admitting: Family Medicine

## 2018-10-01 NOTE — Telephone Encounter (Signed)
Orders were changed

## 2018-10-16 ENCOUNTER — Other Ambulatory Visit: Payer: Self-pay

## 2018-10-16 ENCOUNTER — Ambulatory Visit
Admission: RE | Admit: 2018-10-16 | Discharge: 2018-10-16 | Disposition: A | Discharge status: Home / Self Care | Payer: BC Managed Care – PPO | Source: Ambulatory Visit | Attending: Family Medicine | Admitting: Family Medicine

## 2018-10-16 DIAGNOSIS — N643 Galactorrhea not associated with childbirth: Secondary | ICD-10-CM

## 2018-11-14 IMAGING — MG MM DIGITAL SCREENING BILAT W/ TOMO W/ CAD
8 of 12 series · 8 of 28 positions shown · non-contrast
Comparison: Previous exam(s).

CLINICAL DATA: Screening.

EXAM:
2D DIGITAL SCREENING BILATERAL MAMMOGRAM WITH CAD AND ADJUNCT TOMO

[L CC synth-2D]
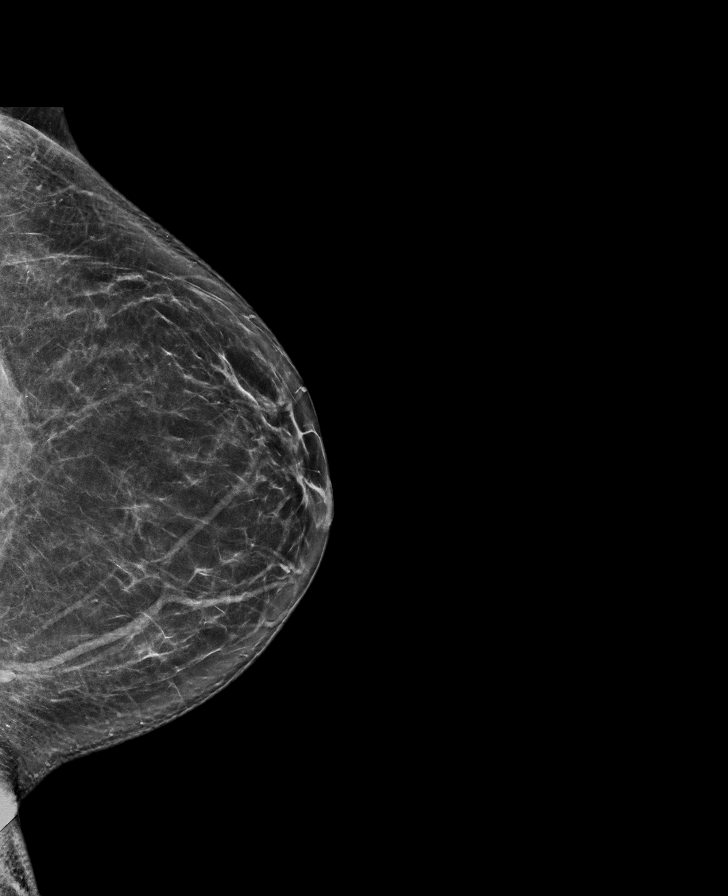

[R MLO]
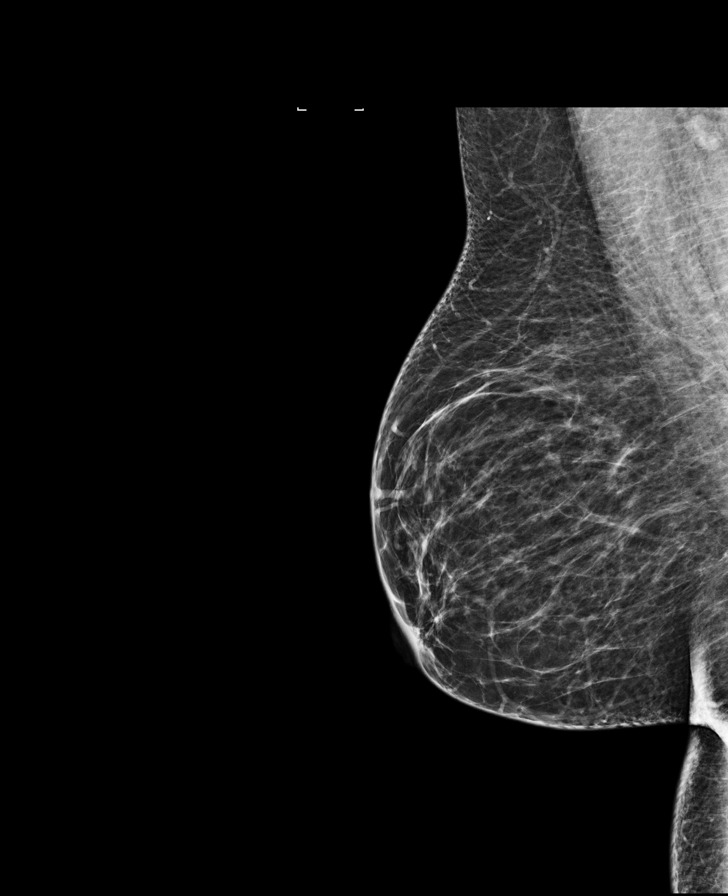

[R CC synth-2D]
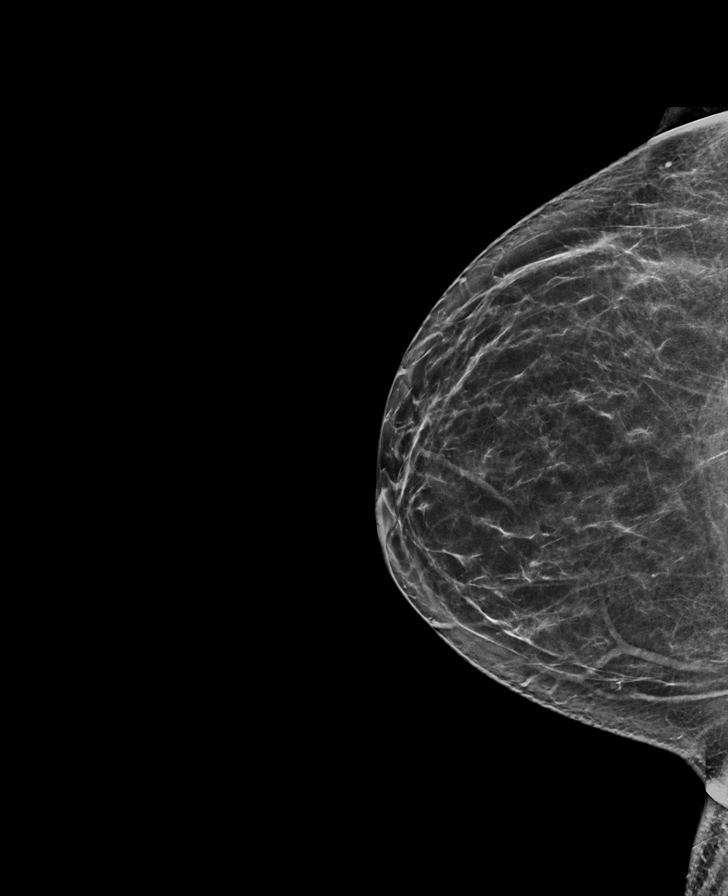

[L MLO]
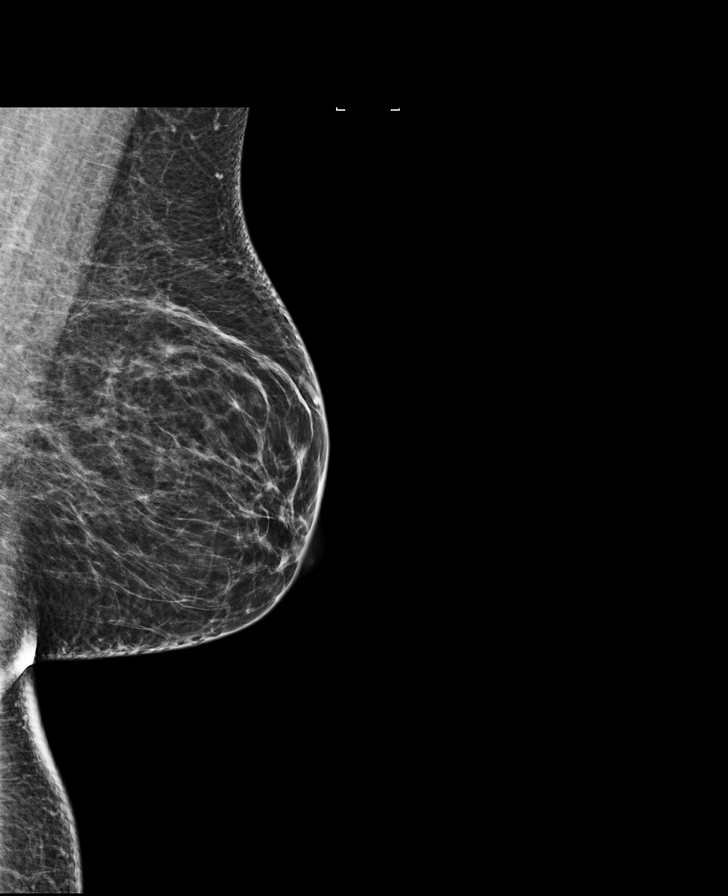

[L CC]
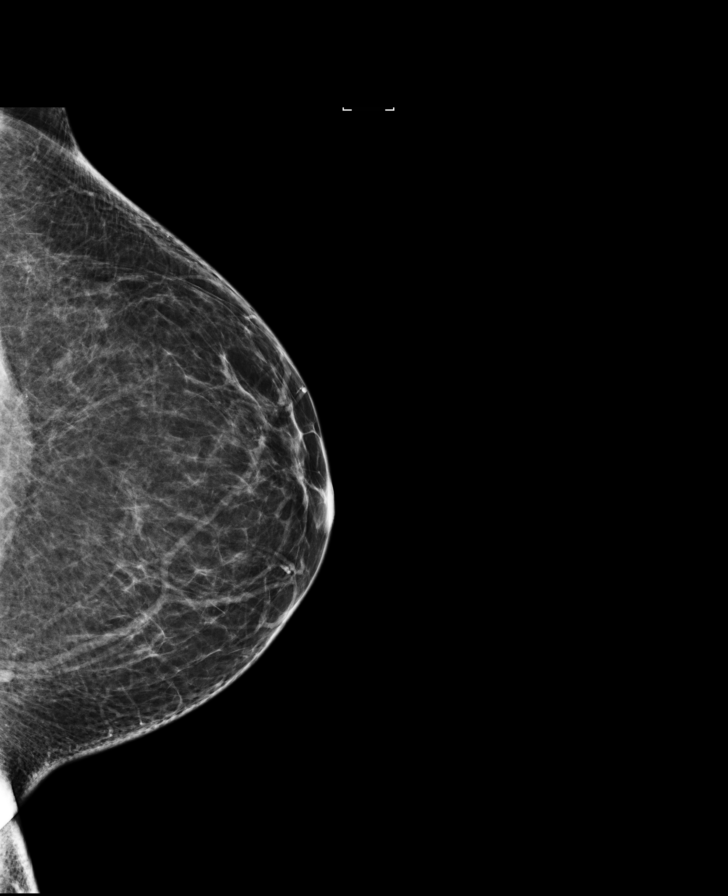

[R CC]
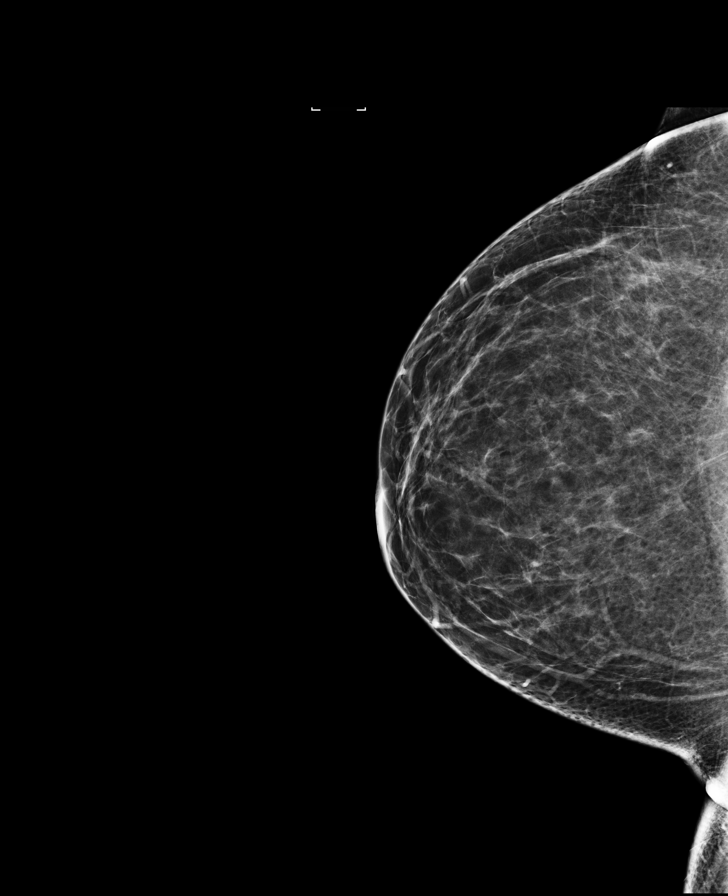

[R MLO synth-2D]
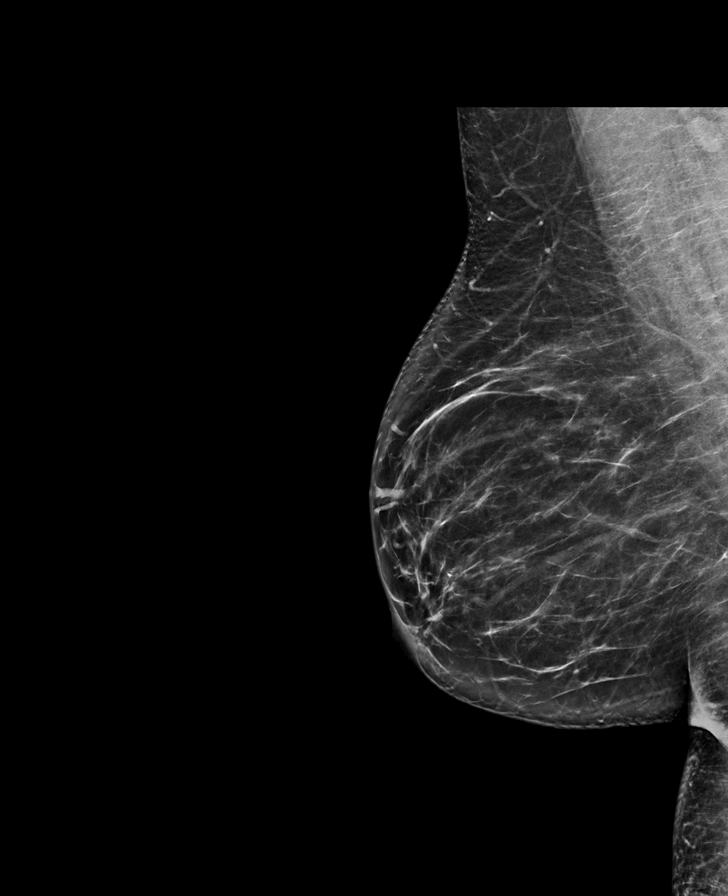

[L MLO synth-2D]
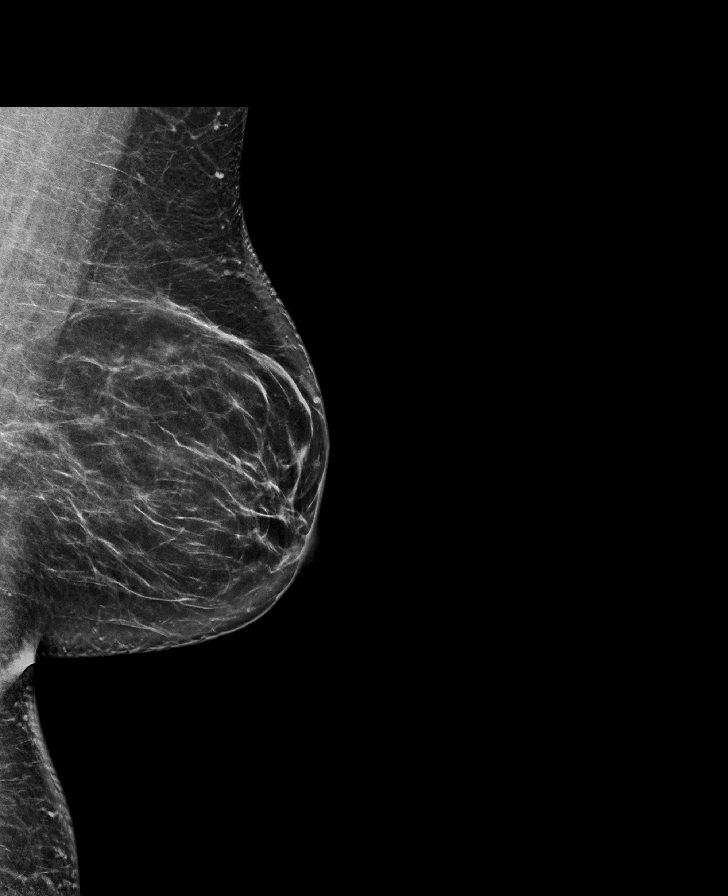

[8 of 28 positions shown; findings below may reference images not displayed]

ACR Breast Density Category b: There are scattered areas of
fibroglandular density.
FINDINGS: There are no findings suspicious for malignancy. Images were
processed with CAD.
IMPRESSION: No mammographic evidence of malignancy. A result letter of this
screening mammogram will be mailed directly to the patient.

RECOMMENDATION:
Screening mammogram at age 38. (Code:KX-K-9TC)

BI-RADS CATEGORY  1: Negative.

## 2018-12-04 ENCOUNTER — Telehealth: Payer: Self-pay

## 2018-12-04 NOTE — Telephone Encounter (Signed)
Does pt need an OV?   Thanks,   -Mickel Baas

## 2018-12-04 NOTE — Telephone Encounter (Signed)
Patient would like to know if she can have those forms filled given by the school for existing health conditions. She would like to drop them off. Please advise.

## 2018-12-04 NOTE — Telephone Encounter (Signed)
She can go ahead and drop them off or send them through Mychart and I'll look at them and see if I can do them without a visit.

## 2018-12-05 ENCOUNTER — Encounter: Payer: Self-pay | Admitting: Family Medicine

## 2018-12-05 NOTE — Telephone Encounter (Signed)
Patient was advised.  

## 2018-12-11 ENCOUNTER — Other Ambulatory Visit: Payer: Self-pay

## 2018-12-11 ENCOUNTER — Encounter: Payer: Self-pay | Admitting: Certified Nurse Midwife

## 2018-12-11 ENCOUNTER — Ambulatory Visit (INDEPENDENT_AMBULATORY_CARE_PROVIDER_SITE_OTHER): Payer: BC Managed Care – PPO | Admitting: Certified Nurse Midwife

## 2018-12-11 VITALS — BP 120/80 | Ht 67.5 in | Wt 243.0 lb

## 2018-12-11 DIAGNOSIS — Z124 Encounter for screening for malignant neoplasm of cervix: Secondary | ICD-10-CM

## 2018-12-11 DIAGNOSIS — Z3041 Encounter for surveillance of contraceptive pills: Secondary | ICD-10-CM

## 2018-12-11 DIAGNOSIS — Z01419 Encounter for gynecological examination (general) (routine) without abnormal findings: Secondary | ICD-10-CM | POA: Diagnosis not present

## 2018-12-11 MED ORDER — SLYND 4 MG PO TABS: 1.0000 | ORAL_TABLET | Freq: Every day | ORAL | 3 refills | Status: DC
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
## 2018-12-21 ENCOUNTER — Other Ambulatory Visit: Payer: Self-pay | Admitting: Certified Nurse Midwife

## 2018-12-22 LAB — IGP, APTIMA HPV: HPV Aptima: NEGATIVE

## 2018-12-30 ENCOUNTER — Encounter: Payer: Self-pay | Admitting: Obstetrics and Gynecology

## 2019-01-17 ENCOUNTER — Other Ambulatory Visit: Payer: Self-pay | Admitting: Family Medicine

## 2019-01-23 ENCOUNTER — Other Ambulatory Visit: Payer: Self-pay | Admitting: Certified Nurse Midwife

## 2019-01-23 MED ORDER — NORETHINDRONE 0.35 MG PO TABS: 1.0000 | ORAL_TABLET | Freq: Every day | ORAL | 3 refills | Status: DC

## 2019-01-23 NOTE — Telephone Encounter (Signed)
Please advise 

## 2019-02-20 ENCOUNTER — Other Ambulatory Visit: Payer: Self-pay

## 2019-02-20 ENCOUNTER — Ambulatory Visit (INDEPENDENT_AMBULATORY_CARE_PROVIDER_SITE_OTHER): Payer: BC Managed Care – PPO | Admitting: Family Medicine

## 2019-02-20 ENCOUNTER — Other Ambulatory Visit (HOSPITAL_COMMUNITY)
Admission: RE | Admit: 2019-02-20 | Discharge: 2019-02-20 | Disposition: A | Discharge status: Home / Self Care | Payer: BC Managed Care – PPO | Source: Ambulatory Visit | Attending: Family Medicine | Admitting: Family Medicine

## 2019-02-20 ENCOUNTER — Encounter: Payer: Self-pay | Admitting: Family Medicine

## 2019-02-20 VITALS — BP 136/79 | HR 64 | Temp 96.6°F | Ht 67.5 in | Wt 237.4 lb

## 2019-02-20 DIAGNOSIS — Z0000 Encounter for general adult medical examination without abnormal findings: Secondary | ICD-10-CM | POA: Diagnosis not present

## 2019-02-20 DIAGNOSIS — N898 Other specified noninflammatory disorders of vagina: Secondary | ICD-10-CM | POA: Diagnosis not present

## 2019-02-20 DIAGNOSIS — Z803 Family history of malignant neoplasm of breast: Secondary | ICD-10-CM

## 2019-02-20 DIAGNOSIS — Z23 Encounter for immunization: Secondary | ICD-10-CM

## 2019-02-20 DIAGNOSIS — I10 Essential (primary) hypertension: Secondary | ICD-10-CM | POA: Diagnosis not present

## 2019-02-20 DIAGNOSIS — R8290 Unspecified abnormal findings in urine: Secondary | ICD-10-CM

## 2019-02-20 LAB — POCT URINALYSIS DIPSTICK
Bilirubin, UA: NEGATIVE
Blood, UA: NEGATIVE
Glucose, UA: NEGATIVE
Ketones, UA: NEGATIVE
Leukocytes, UA: NEGATIVE
Nitrite, UA: NEGATIVE
Protein, UA: NEGATIVE
Spec Grav, UA: 1.015 (ref 1.010–1.025)
Urobilinogen, UA: 0.2 E.U./dL
pH, UA: 7.5 (ref 5.0–8.0)

## 2019-02-20 NOTE — Assessment & Plan Note (Signed)
Well controlled Continue current medications Recheck metabolic panel F/u in 6 months  

## 2019-02-20 NOTE — Progress Notes (Signed)
Patient: Elizabeth Stevens, Female    DOB: 1980/09/11, 38 y.o.   MRN: [id number] Visit Date: 02/20/2019  Today's Provider: [name], MD   Chief Complaint  Patient presents with  . Annual Exam   Subjective:    I, Tiburcio Pea, CMA, am acting as a scribe for [name], MD.    Annual physical exam Elizabeth Stevens is a 38 y.o. female who presents today for health maintenance and complete physical. She feels well. She reports exercising 6-7 days per week. She reports she is sleeping fairly well.  -----------------------------------------------------------------  Hypertension, follow-up:  BP Readings from Last 3 Encounters:  02/20/19 136/79  12/11/18 120/80  09/01/18 129/84    She was last seen for hypertension 6 months ago.  BP at that visit was 129/84. Management changes since that visit include no change. She reports good compliance with treatment. She is not having side effects.  She is exercising. She is adherent to low salt diet.   Outside blood pressures are not being checked at home. She is experiencing none.  Patient denies chest pain, chest pressure/discomfort, claudication, dyspnea, exertional chest pressure/discomfort, fatigue, irregular heart beat, lower extremity edema, near-syncope, orthopnea, palpitations, paroxysmal nocturnal dyspnea, syncope and tachypnea.   Cardiovascular risk factors include hypertension.  Use of agents associated with hypertension: none.     Weight trend: stable Wt Readings from Last 3 Encounters:  02/20/19 237 lb 6.4 oz (107.7 kg)  12/11/18 243 lb (110.2 kg)  05/28/18 273 lb (123.8 kg)   ------------------------------------------------------------------------  Patient recently switched birth control per GYN, had increased bleeding and switched back to minipill LMP last week - noticed there was an abnormal smell during this Now urine is cloudy Denies dysuria, urinary frequency, abd pain, fever  Review  of Systems  Constitutional: Negative.   HENT: Negative.   Eyes: Negative.   Respiratory: Negative.   Cardiovascular: Negative.   Gastrointestinal: Negative.   Endocrine: Negative.   Genitourinary: Positive for vaginal bleeding.  Musculoskeletal: Negative.   Skin: Negative.   Allergic/Immunologic: Negative.   Neurological: Positive for headaches.  Hematological: Negative.   Psychiatric/Behavioral: Negative.     Social History      She  reports that she quit smoking about 13 years ago. Her smoking use included cigarettes. She has never used smokeless tobacco. She reports current alcohol use. She reports that she does not use drugs.       Social History   Socioeconomic History  . Marital status: Married    Spouse name: Elizabeth Stevens  . Number of children: 3  . Years of education: 16  . Highest education level: Bachelor's degree (e.g., BA, AB, BS)  Occupational History  . Occupation: Pharmacist, hospital    Comment: [employer]  . Financial resource strain: Not on file  . Food insecurity    Worry: Not on file    Inability: Not on file  . Transportation needs    Medical: Not on file    Non-medical: Not on file  Tobacco Use  . Smoking status: Former Smoker    Types: Cigarettes    Quit date: 05/21/2005    Years since quitting: 13.7  . Smokeless tobacco: Never Used  . Tobacco comment: former social smoker  Substance and Sexual Activity  . Alcohol use: Yes    Comment: drinks wine about 2 times per month  . Drug use: No  . Sexual activity: Yes    Partners: Male  Birth control/protection: Pill  Lifestyle  . Physical activity    Days per week: Not on file    Minutes per session: Not on file  . Stress: Not on file  Relationships  . Social Herbalist on phone: Not on file    Gets together: Not on file    Attends religious service: Not on file    Active member of club or organization: Not on file    Attends meetings of clubs or organizations: Not on  file    Relationship status: Not on file  Other Topics Concern  . Not on file  Social History Narrative  . Not on file    Past Medical History:  Diagnosis Date  . BRCA negative    family hx BRCA2+; pt is BRCA neg; at population risk (13%)   . FH: BRCA2 gene positive   . Hypertension   . Obesity (BMI 35.0-39.9 without comorbidity)    BMI 41 on 11/29/2017  . Pregnancy induced hypertension      Patient Active Problem List   Diagnosis Date Noted  . Galactorrhea 09/18/2018  . Essential hypertension 12/03/2017  . GAD (generalized anxiety disorder) 12/03/2017  . Family history of breast cancer 10/18/2016  . BRCA negative   . Obesity     Past Surgical History:  Procedure Laterality Date  . CESAREAN SECTION  10/16/07; 01/24/2010; 11/20/2011    Family History        Family Status  Relation Name Status  . Mother  Deceased  . Father  Alive  . Sister  Alive  . Mat Aunt  (Not Specified)  . PGM  (Not Specified)  . PGF  (Not Specified)  . Cousin  (Not Specified)  . Other mat great aunt Alive  . Neg Hx  (Not Specified)        Her family history includes BRCA 1/2 in her cousin; Breast cancer in an other family member; Breast cancer (age of onset: 48) in her mother; Breast cancer (age of onset: 26) in her maternal aunt; Cervical cancer (age of onset: 46) in her paternal grandmother; Heart failure in her maternal aunt and paternal grandfather; Hypertension in her father; Seizures in her sister. There is no history of Colon cancer, Ovarian cancer, or Diabetes.      No Known Allergies   Current Outpatient Medications:  .  amLODipine (NORVASC) 5 MG tablet, TAKE 1 TABLET(5 MG) BY MOUTH DAILY, Disp: 90 tablet, Rfl: 3 .  citalopram (CELEXA) 20 MG tablet, TAKE 1 TABLET(20 MG) BY MOUTH DAILY, Disp: 90 tablet, Rfl: 3 .  Multiple Vitamin (MULTIVITAMIN) tablet, Take 1 tablet by mouth daily., Disp: , Rfl:  .  norethindrone (MICRONOR) 0.35 MG tablet, Take 1 tablet (0.35 mg total) by mouth daily.,  Disp: 3 Package, Rfl: 3   Patient Care Team: Virginia Crews, MD as PCP - General (Family Medicine)    Objective:    Vitals: BP 136/79 (BP Location: Right Arm, Patient Position: Sitting, Cuff Size: Large)   Pulse 64   Temp (!) 96.6 F (35.9 C) (Temporal)   Ht 5' 7.5" (1.715 m)   Wt 237 lb 6.4 oz (107.7 kg)   SpO2 98%   BMI 36.63 kg/m    Vitals:   02/20/19 1002  BP: 136/79  Pulse: 64  Temp: (!) 96.6 F (35.9 C)  TempSrc: Temporal  SpO2: 98%  Weight: 237 lb 6.4 oz (107.7 kg)  Height: 5' 7.5" (1.715 m)     Physical  Exam Vitals signs reviewed.  Constitutional:      General: She is not in acute distress.    Appearance: Normal appearance. She is well-developed. She is not diaphoretic.  HENT:     Head: Normocephalic and atraumatic.     Right Ear: Tympanic membrane, ear canal and external ear normal.     Left Ear: Tympanic membrane, ear canal and external ear normal.  Eyes:     General: No scleral icterus.    Conjunctiva/sclera: Conjunctivae normal.     Pupils: Pupils are equal, round, and reactive to light.  Neck:     Musculoskeletal: Neck supple.     Thyroid: No thyromegaly.  Cardiovascular:     Rate and Rhythm: Normal rate and regular rhythm.     Pulses: Normal pulses.     Heart sounds: Normal heart sounds. No murmur.  Pulmonary:     Effort: Pulmonary effort is normal. No respiratory distress.     Breath sounds: Normal breath sounds. No wheezing or rales.  Abdominal:     General: There is no distension.     Palpations: Abdomen is soft.     Tenderness: There is no abdominal tenderness. There is no right CVA tenderness, left CVA tenderness, guarding or rebound.  Genitourinary:    Comments: GYN:  External genitalia within normal limits.  Vaginal mucosa pink, moist, normal rugae.  Nonfriable cervix without lesions, no discharge or bleeding noted on speculum exam.  Retained tampon in vaginal cul-de-sac with foul smell and dark brown color Musculoskeletal:         General: No deformity.     Right lower leg: No edema.     Left lower leg: No edema.  Lymphadenopathy:     Cervical: No cervical adenopathy.  Skin:    General: Skin is warm and dry.     Capillary Refill: Capillary refill takes less than 2 seconds.     Findings: No rash.  Neurological:     Mental Status: She is alert and oriented to person, place, and time. Mental status is at baseline.  Psychiatric:        Mood and Affect: Mood normal.        Behavior: Behavior normal.        Thought Content: Thought content normal.      Depression Screen PHQ 2/9 Scores 02/20/2019 12/03/2017  PHQ - 2 Score 0 1  PHQ- 9 Score 1 -       Assessment & Plan:     Routine Health Maintenance and Physical Exam  Exercise Activities and Dietary recommendations Goals   None     Immunization History  Administered Date(s) Administered  . Influenza,inj,Quad PF,6+ Mos 02/20/2019  . Tdap 02/20/2019    Health Maintenance  Topic Date Due  . MAMMOGRAM  10/15/2020  . PAP SMEAR-Modifier  12/10/2021  . TETANUS/TDAP  02/19/2029  . INFLUENZA VACCINE  Completed  . HIV Screening  Completed     Discussed health benefits of physical activity, and encouraged her to engage in regular exercise appropriate for her age and condition.    --------------------------------------------------------------------  Problem List Items Addressed This Visit      Cardiovascular and Mediastinum   Essential hypertension    Well controlled Continue current medications Recheck metabolic panel F/u in 6 months       Relevant Orders   Comprehensive metabolic panel     Other   Obesity    Discussed importance of healthy weight management Discussed diet and exercise  Family history of breast cancer    Continue annual mammograms       Other Visit Diagnoses    Encounter for annual physical exam    -  Primary   Relevant Orders   Comprehensive metabolic panel   Lipid panel   TSH   CBC w/Diff/Platelet    Vaginal discharge       Relevant Orders   Cervicovaginal ancillary only   Cloudy urine        - UA is clear - retained tampon with foul smell found in vaginal cul-de-sac on exam - and removed - discussed with patient that this is the likely source of smell that she experienced through last menstrual cycle, but she insists that she must have put it in last night - will still swab for BV and yeast - no signs of TSS, so no need for empiric antibiotics at this time   Return in about 6 months (around 08/21/2019) for chronic disease f/u.   The entirety of the information documented in the History of Present Illness, Review of Systems and Physical Exam were personally obtained by me. Portions of this information were initially documented by Tiburcio Pea, CMA and reviewed by me for thoroughness and accuracy.    Jancarlos Thrun, Dionne Bucy, MD MPH [facility]

## 2019-02-20 NOTE — Assessment & Plan Note (Signed)
Discussed importance of healthy weight management Discussed diet and exercise  

## 2019-02-20 NOTE — Patient Instructions (Signed)

## 2019-02-20 NOTE — Assessment & Plan Note (Signed)
Continue annual mammograms  

## 2019-02-21 LAB — COMPREHENSIVE METABOLIC PANEL
ALT: 11 IU/L (ref 0–32)
AST: 17 IU/L (ref 0–40)
Albumin/Globulin Ratio: 2 (ref 1.2–2.2)
Albumin: 4.2 g/dL (ref 3.8–4.8)
Alkaline Phosphatase: 62 IU/L (ref 39–117)
BUN/Creatinine Ratio: 16 (ref 9–23)
BUN: 12 mg/dL (ref 6–20)
Bilirubin Total: 0.5 mg/dL (ref 0.0–1.2)
CO2: 22 mmol/L (ref 20–29)
Calcium: 9.5 mg/dL (ref 8.7–10.2)
Chloride: 104 mmol/L (ref 96–106)
Creatinine, Ser: 0.76 mg/dL (ref 0.57–1.00)
GFR calc Af Amer: 114 mL/min/{1.73_m2} (ref >59)
GFR calc non Af Amer: 99 mL/min/{1.73_m2} (ref >59)
Globulin, Total: 2.1 g/dL (ref 1.5–4.5)
Glucose: 88 mg/dL (ref 65–99)
Potassium: 4.2 mmol/L (ref 3.5–5.2)
Sodium: 139 mmol/L (ref 134–144)
Total Protein: 6.3 g/dL (ref 6.0–8.5)

## 2019-02-21 LAB — CBC WITH DIFFERENTIAL/PLATELET
Basophils Absolute: 0.1 x10E3/uL (ref 0.0–0.2)
Basos: 1 %
EOS (ABSOLUTE): 0.2 x10E3/uL (ref 0.0–0.4)
Eos: 2 %
Hematocrit: 39.3 % (ref 34.0–46.6)
Hemoglobin: 13.1 g/dL (ref 11.1–15.9)
Immature Grans (Abs): 0 x10E3/uL (ref 0.0–0.1)
Immature Granulocytes: 0 %
Lymphocytes Absolute: 2.5 x10E3/uL (ref 0.7–3.1)
Lymphs: 28 %
MCH: 31 pg (ref 26.6–33.0)
MCHC: 33.3 g/dL (ref 31.5–35.7)
MCV: 93 fL (ref 79–97)
Monocytes Absolute: 0.6 x10E3/uL (ref 0.1–0.9)
Monocytes: 6 %
Neutrophils Absolute: 5.7 x10E3/uL (ref 1.4–7.0)
Neutrophils: 63 %
Platelets: 356 x10E3/uL (ref 150–450)
RBC: 4.22 x10E6/uL (ref 3.77–5.28)
RDW: 12.5 % (ref 11.7–15.4)
WBC: 9.1 x10E3/uL (ref 3.4–10.8)

## 2019-02-21 LAB — LIPID PANEL
Chol/HDL Ratio: 3.9 ratio (ref 0.0–4.4)
Cholesterol, Total: 184 mg/dL (ref 100–199)
HDL: 47 mg/dL (ref >39)
LDL Chol Calc (NIH): 122 mg/dL — ABNORMAL HIGH (ref 0–99)
Triglycerides: 83 mg/dL (ref 0–149)
VLDL Cholesterol Cal: 15 mg/dL (ref 5–40)

## 2019-02-21 LAB — TSH: TSH: 2.74 u[IU]/mL (ref 0.450–4.500)

## 2019-02-23 LAB — CERVICOVAGINAL ANCILLARY ONLY
Bacterial Vaginitis (gardnerella): NEGATIVE
Candida Glabrata: NEGATIVE
Candida Vaginitis: NEGATIVE

## 2019-04-17 ENCOUNTER — Other Ambulatory Visit: Payer: Self-pay | Admitting: Certified Nurse Midwife

## 2019-04-17 ENCOUNTER — Other Ambulatory Visit: Payer: Self-pay | Admitting: Family Medicine

## 2019-04-17 ENCOUNTER — Encounter: Payer: Self-pay | Admitting: Family Medicine

## 2019-04-17 ENCOUNTER — Telehealth: Payer: Self-pay

## 2019-04-17 DIAGNOSIS — I10 Essential (primary) hypertension: Secondary | ICD-10-CM

## 2019-04-17 DIAGNOSIS — F411 Generalized anxiety disorder: Secondary | ICD-10-CM

## 2019-04-17 NOTE — Telephone Encounter (Signed)
[facility] faxed refill request for the following medications:   Amox-Clav 875mg   Take 1 tablet by mouth twice daily for 7 days. Qty:  14   Please advise.  Thanks, [employer]

## 2019-04-17 NOTE — Telephone Encounter (Signed)
Patient does not need Amox-Clav or Meloxicam.

## 2019-04-17 NOTE — Telephone Encounter (Signed)
°  [facility] faxed refill request for the following medications:  Meloxicam 15mg   Take 1 by mouth daily   Please advise.  Thanks, [employer]

## 2019-04-20 NOTE — Telephone Encounter (Signed)
Patient called in and is requesting that these medications be filled and sent to local pharmacy and it states she has refills but pharmacy cannot pull up information. Please advise.

## 2019-04-20 NOTE — Telephone Encounter (Signed)
From [facility] 

## 2019-04-21 ENCOUNTER — Other Ambulatory Visit: Payer: Self-pay

## 2019-04-21 MED ORDER — CITALOPRAM HYDROBROMIDE 20 MG PO TABS: ORAL_TABLET | ORAL | 3 refills | Status: DC

## 2019-04-21 MED ORDER — NORETHINDRONE 0.35 MG PO TABS: 1.0000 | ORAL_TABLET | Freq: Every day | ORAL | 3 refills | Status: DC

## 2019-04-21 MED ORDER — AMLODIPINE BESYLATE 5 MG PO TABS: ORAL_TABLET | ORAL | 3 refills | Status: DC

## 2019-04-21 NOTE — Telephone Encounter (Signed)
Please review.  Patient requested refills from [facility] also on 04/17/2019

## 2019-04-27 ENCOUNTER — Other Ambulatory Visit: Payer: Self-pay | Admitting: Family Medicine

## 2019-04-27 NOTE — Telephone Encounter (Addendum)
[facility] faxed refill request for the following medications:  meloxicam (MOBIC) 15 MG tablet  amoxicillin-clavulanate (AUGMENTIN) 875-125 MG tablet    Please advise.

## 2019-04-27 NOTE — Telephone Encounter (Signed)
Patient reports she did not request refills on meloxicam or augmentin

## 2019-07-02 ENCOUNTER — Encounter: Payer: Self-pay | Admitting: Family Medicine

## 2019-07-03 MED ORDER — CITALOPRAM HYDROBROMIDE 20 MG PO TABS: ORAL_TABLET | ORAL | 3 refills | Status: DC

## 2019-07-03 MED ORDER — CITALOPRAM HYDROBROMIDE 40 MG PO TABS: 40.0000 mg | ORAL_TABLET | Freq: Every day | ORAL | 3 refills | Status: DC

## 2019-07-18 ENCOUNTER — Encounter: Payer: Self-pay | Admitting: Family Medicine

## 2019-07-18 ENCOUNTER — Ambulatory Visit: Payer: BC Managed Care – PPO | Attending: Internal Medicine

## 2019-07-18 DIAGNOSIS — Z23 Encounter for immunization: Secondary | ICD-10-CM | POA: Insufficient documentation

## 2019-07-18 NOTE — Progress Notes (Signed)
   Covid-19 Vaccination Clinic  Name:  KRYSIA ZAHRADNIK    MRN: [id number] DOB: October 27, 1980  07/18/2019  Ms. Soller was observed post Covid-19 immunization for 15 minutes without incidence. She was provided with Vaccine Information Sheet and instruction to access the V-Safe system.   Ms. Darius was instructed to call 911 with any severe reactions post vaccine: Marland Kitchen Difficulty breathing  . Swelling of your face and throat  . A fast heartbeat  . A bad rash all over your body  . Dizziness and weakness    Immunizations Administered    Name Date Dose VIS Date Route   Moderna COVID-19 Vaccine 07/18/2019 12:30 PM 0.5 mL 04/21/2019 Intramuscular   Manufacturer: Moderna   Lot: [id number]   NDC: 86751-982-42

## 2019-08-15 ENCOUNTER — Ambulatory Visit: Payer: BC Managed Care – PPO | Attending: Internal Medicine

## 2019-08-15 DIAGNOSIS — Z23 Encounter for immunization: Secondary | ICD-10-CM

## 2019-08-15 NOTE — Progress Notes (Signed)
   Covid-19 Vaccination Clinic  Name:  JESS SULAK    MRN: [id number] DOB: Mar 15, 1981  08/15/2019  Ms. Hudon was observed post Covid-19 immunization for 15 minutes without incident. She was provided with Vaccine Information Sheet and instruction to access the V-Safe system.   Ms. Carder was instructed to call 911 with any severe reactions post vaccine: Marland Kitchen Difficulty breathing  . Swelling of face and throat  . A fast heartbeat  . A bad rash all over body  . Dizziness and weakness   Immunizations Administered    Name Date Dose VIS Date Route   Moderna COVID-19 Vaccine 08/15/2019 10:58 AM 0.5 mL 04/21/2019 Intramuscular   Manufacturer: Gala Murdoch   Lot: [id number]   NDC: 41638-453-64

## 2019-08-21 ENCOUNTER — Encounter: Payer: Self-pay | Admitting: Family Medicine

## 2019-08-21 ENCOUNTER — Other Ambulatory Visit: Payer: Self-pay

## 2019-08-21 ENCOUNTER — Ambulatory Visit (INDEPENDENT_AMBULATORY_CARE_PROVIDER_SITE_OTHER): Payer: BC Managed Care – PPO | Admitting: Family Medicine

## 2019-08-21 VITALS — BP 129/82 | HR 77 | Temp 96.8°F | Wt 241.0 lb

## 2019-08-21 DIAGNOSIS — F411 Generalized anxiety disorder: Secondary | ICD-10-CM | POA: Diagnosis not present

## 2019-08-21 DIAGNOSIS — I10 Essential (primary) hypertension: Secondary | ICD-10-CM

## 2019-08-21 MED ORDER — CITALOPRAM HYDROBROMIDE 40 MG PO TABS: 40.0000 mg | ORAL_TABLET | Freq: Every day | ORAL | 3 refills | Status: DC

## 2019-08-21 NOTE — Assessment & Plan Note (Signed)
Well controlled Continue current medications Recheck metabolic panel F/u in 6 months  

## 2019-08-21 NOTE — Assessment & Plan Note (Addendum)
Chronic and well controlled Continue celexa 40 mg daily Have previously discussed synergistic effects of therapy and medications F/u in 6 months - repeat PHQ and GAD7 at next visit

## 2019-08-21 NOTE — Progress Notes (Signed)
Patient: Elizabeth Stevens Female    DOB: 1981/01/21   39 y.o.   MRN: [id number] Visit Date: 08/21/2019  Today's Provider: Shirlee Latch, MD   Chief Complaint  Patient presents with  . Hypertension  . Anxiety   Subjective:     Anxiety Presents for follow-up (Pt recently increased Celexa to 40mg .  Pt reports improvement with her symptoms.) visit. Symptoms include nervous/anxious behavior. Patient reports no decreased concentration, depressed mood, dizziness, excessive worry, panic or suicidal ideas. The quality of sleep is fair. Nighttime awakenings: several.      GAD 7 : Generalized Anxiety Score 08/21/2019 12/03/2017  Nervous, Anxious, on Edge 0 3  Control/stop worrying 0 1  Worry too much - different things 0 0  Trouble relaxing 0 1  Restless 0 0  Easily annoyed or irritable 1 2  Afraid - awful might happen 0 0  Total GAD 7 Score 1 7  Anxiety Difficulty Not difficult at all Somewhat difficult     Depression screen [facility] 2/9 08/21/2019 02/20/2019 12/03/2017  Decreased Interest 1 0 1  Down, Depressed, Hopeless 0 0 0  PHQ - 2 Score 1 0 1  Altered sleeping 1 0 -  Tired, decreased energy 1 1 -  Change in appetite 0 0 -  Feeling bad or failure about yourself  0 0 -  Trouble concentrating 0 0 -  Moving slowly or fidgety/restless 0 0 -  Suicidal thoughts 0 0 -  PHQ-9 Score 3 1 -  Difficult doing work/chores Not difficult at all Not difficult at all -      Hypertension, follow-up:  BP Readings from Last 3 Encounters:  08/21/19 129/82  02/20/19 136/79  12/11/18 120/80    She was last seen for hypertension 6 months ago.  BP at that visit was 136/79. Management since that visit includes no changes. She reports excellent compliance with treatment. She is not having side effects.  She is exercising. She is adherent to low salt diet.   Outside blood pressures are not being checked. She is experiencing none.  Patient denies chest pain, fatigue, near-syncope and  palpitations.   Cardiovascular risk factors include hypertension.  Use of agents associated with hypertension: none.     Weight trend: stable Wt Readings from Last 3 Encounters:  08/21/19 241 lb (109.3 kg)  02/20/19 237 lb 6.4 oz (107.7 kg)  12/11/18 243 lb (110.2 kg)    Current diet: in general, a "healthy" diet    ------------------------------------------------------------------------   No Known Allergies   Current Outpatient Medications:  .  amLODipine (NORVASC) 5 MG tablet, TAKE 1 TABLET(5 MG) BY MOUTH DAILY, Disp: 90 tablet, Rfl: 3 .  citalopram (CELEXA) 40 MG tablet, Take 1 tablet (40 mg total) by mouth daily., Disp: 90 tablet, Rfl: 3 .  Multiple Vitamin (MULTIVITAMIN) tablet, Take 1 tablet by mouth daily., Disp: , Rfl:  .  norethindrone (MICRONOR) 0.35 MG tablet, Take 1 tablet (0.35 mg total) by mouth daily., Disp: 3 Package, Rfl: 3  Review of Systems  Constitutional: Negative.   Respiratory: Negative.   Cardiovascular: Negative.   Gastrointestinal: Negative.   Neurological: Positive for headaches (occasionally). Negative for dizziness and light-headedness.  Psychiatric/Behavioral: Negative for decreased concentration, dysphoric mood, self-injury and suicidal ideas. The patient is nervous/anxious.     Social History   Tobacco Use  . Smoking status: Former Smoker    Types: Cigarettes    Quit date: 05/21/2005    Years since quitting: 14.2  .  Smokeless tobacco: Never Used  . Tobacco comment: former social smoker  Substance Use Topics  . Alcohol use: Yes    Comment: drinks wine about 2 times per month      Objective:   BP 129/82 (BP Location: Left Arm, Patient Position: Sitting, Cuff Size: Large)   Pulse 77   Temp (!) 96.8 F (36 C) (Temporal)   Wt 241 lb (109.3 kg)   BMI 37.19 kg/m  Vitals:   08/21/19 1049  BP: 129/82  Pulse: 77  Temp: (!) 96.8 F (36 C)  TempSrc: Temporal  Weight: 241 lb (109.3 kg)  Body mass index is 37.19 kg/m.   Physical  Exam Vitals reviewed.  Constitutional:      General: She is not in acute distress.    Appearance: Normal appearance. She is well-developed. She is not diaphoretic.  HENT:     Head: Normocephalic and atraumatic.  Eyes:     General: No scleral icterus.    Conjunctiva/sclera: Conjunctivae normal.  Neck:     Thyroid: No thyromegaly.  Cardiovascular:     Rate and Rhythm: Normal rate and regular rhythm.     Pulses: Normal pulses.     Heart sounds: Normal heart sounds. No murmur.  Pulmonary:     Effort: Pulmonary effort is normal. No respiratory distress.     Breath sounds: Normal breath sounds. No wheezing, rhonchi or rales.  Musculoskeletal:     Cervical back: Neck supple.     Right lower leg: No edema.     Left lower leg: No edema.  Lymphadenopathy:     Cervical: No cervical adenopathy.  Skin:    General: Skin is warm and dry.     Findings: No rash.  Neurological:     Mental Status: She is alert and oriented to person, place, and time. Mental status is at baseline.  Psychiatric:        Mood and Affect: Mood normal.        Behavior: Behavior normal.      No results found for any visits on 08/21/19.     Assessment & Plan    Problem List Items Addressed This Visit      Cardiovascular and Mediastinum   Essential hypertension - Primary    Well controlled Continue current medications Recheck metabolic panel F/u in 6 months       Relevant Orders   Basic Metabolic Panel (BMET)     Other   Morbid obesity ([place])    Discussed importance of healthy weight management Discussed diet and exercise       GAD (generalized anxiety disorder)    Chronic and well controlled Continue celexa 40 mg daily Have previously discussed synergistic effects of therapy and medications F/u in 6 months - repeat PHQ and GAD7 at next visit      Relevant Medications   citalopram (CELEXA) 40 MG tablet       Return in about 6 months (around 02/20/2020) for CPE, as scheduled.   The  entirety of the information documented in the History of Present Illness, Review of Systems and Physical Exam were personally obtained by me. Portions of this information were initially documented by Ashley Royalty, CMA and reviewed by me for thoroughness and accuracy.    Kensley Lares, Dionne Bucy, MD MPH [facility]

## 2019-08-21 NOTE — Assessment & Plan Note (Signed)
Discussed importance of healthy weight management Discussed diet and exercise  

## 2019-08-28 ENCOUNTER — Ambulatory Visit: Payer: BC Managed Care – PPO | Admitting: Family Medicine

## 2019-08-28 LAB — BASIC METABOLIC PANEL
BUN/Creatinine Ratio: 13 (ref 9–23)
BUN: 11 mg/dL (ref 6–24)
CO2: 22 mmol/L (ref 20–29)
Calcium: 9.7 mg/dL (ref 8.7–10.2)
Chloride: 104 mmol/L (ref 96–106)
Creatinine, Ser: 0.84 mg/dL (ref 0.57–1.00)
GFR calc Af Amer: 101 mL/min/{1.73_m2} (ref >59)
GFR calc non Af Amer: 87 mL/min/{1.73_m2} (ref >59)
Glucose: 88 mg/dL (ref 65–99)
Potassium: 4.4 mmol/L (ref 3.5–5.2)
Sodium: 140 mmol/L (ref 134–144)

## 2019-08-31 ENCOUNTER — Telehealth: Payer: Self-pay

## 2019-08-31 NOTE — Telephone Encounter (Signed)
-----   Message from Erasmo Downer, MD sent at 08/31/2019  9:37 AM EDT ----- Normal labs

## 2019-08-31 NOTE — Telephone Encounter (Signed)
Left message advising pt (per DPR).   Thanks,   -Jalena Vanderlinden  

## 2019-11-05 ENCOUNTER — Other Ambulatory Visit: Payer: Self-pay | Admitting: Certified Nurse Midwife

## 2019-11-05 DIAGNOSIS — Z1231 Encounter for screening mammogram for malignant neoplasm of breast: Secondary | ICD-10-CM

## 2019-11-06 ENCOUNTER — Ambulatory Visit
Admission: RE | Admit: 2019-11-06 | Discharge: 2019-11-06 | Disposition: A | Discharge status: Home / Self Care | Payer: BC Managed Care – PPO | Source: Ambulatory Visit | Attending: Certified Nurse Midwife | Admitting: Certified Nurse Midwife

## 2019-11-06 DIAGNOSIS — Z1231 Encounter for screening mammogram for malignant neoplasm of breast: Secondary | ICD-10-CM | POA: Insufficient documentation

## 2019-11-07 ENCOUNTER — Encounter: Payer: Self-pay | Admitting: Certified Nurse Midwife

## 2019-12-17 ENCOUNTER — Encounter: Payer: Self-pay | Admitting: Certified Nurse Midwife

## 2019-12-17 ENCOUNTER — Other Ambulatory Visit: Payer: Self-pay

## 2019-12-17 ENCOUNTER — Ambulatory Visit (INDEPENDENT_AMBULATORY_CARE_PROVIDER_SITE_OTHER): Payer: BC Managed Care – PPO | Admitting: Certified Nurse Midwife

## 2019-12-17 VITALS — BP 120/80 | HR 67 | Ht 67.5 in | Wt 252.0 lb

## 2019-12-17 DIAGNOSIS — Z01419 Encounter for gynecological examination (general) (routine) without abnormal findings: Secondary | ICD-10-CM

## 2019-12-17 DIAGNOSIS — Z803 Family history of malignant neoplasm of breast: Secondary | ICD-10-CM | POA: Diagnosis not present

## 2019-12-17 DIAGNOSIS — Z3041 Encounter for surveillance of contraceptive pills: Secondary | ICD-10-CM | POA: Diagnosis not present

## 2019-12-17 MED ORDER — NORETHINDRONE 0.35 MG PO TABS: 1.0000 | ORAL_TABLET | Freq: Every day | ORAL | 3 refills | Status: DC

## 2019-12-17 NOTE — Progress Notes (Signed)
Gynecology Annual Exam      PCP: Virginia Crews, MD  Chief Complaint:  Chief Complaint  Patient presents with  . Gynecologic Exam    History of Present Illness Elizabeth Stevens is a 39 year old [id number] WF, who presents for her annual exam.  Her menses on the minipill are usually monthly and last 5 days with a heavier flow on 2 days requiring a tampon change every 3 hours.  She denies dysmenorrhea.. Her LMP was 12/04/2019.  Last Pap: 12/11/2018 Results were: NIL /neg HRHPV Hx of STDs: none Her medical history is notable for anxiety and hypertension.  Since her last exam 12/11/2018, she has regained some of the weight (9#) she lost and is normotensive on her Norvasc 5 mgm daily. Her Celexa dose was increased to 40 mgm. She has had her Moderna vaccine. There is a family history of breast cancer in her mother, maternal aunt, and maternal great aunt. Her maternal aunt and her aunt's daughter were positive for BRCA2. The patient tested negative for BRCA, reducing her risk of breast cancer to 13%. She does do monthly self breast exams.   Last mammogram: 11/06/2019  Results were: negative. There is no family history of ovarian cancer.   Tobacco use: The patient denies current tobacco use. Alcohol use: social drinker-drinks a bottle of wine every other weekend Exercise: by walking, running and using weights 5 days a week. . Her current BMI is 38.86 kg/m2.   She does get adequate calcium in her diet.  She has had a recent lipid panel 02/2019 and it was OK (TC 184,  LDL122, HDL 47)    Review of Systems: Review of Systems  Constitutional: Negative for chills, fever and weight loss.  HENT: Negative for congestion, sinus pain and sore throat.   Eyes: Negative for blurred vision and pain.  Respiratory: Negative for hemoptysis, shortness of breath and wheezing.   Cardiovascular: Negative for chest pain, palpitations and leg swelling.  Gastrointestinal: Negative for abdominal pain, blood in stool,  diarrhea, heartburn, nausea and vomiting.  Genitourinary: Negative for dysuria, frequency, hematuria and urgency.  Musculoskeletal: Negative for back pain, joint pain and myalgias.  Skin: Negative for itching and rash.  Neurological: Negative for dizziness, tingling and headaches.  Endo/Heme/Allergies: Negative for environmental allergies and polydipsia. Does not bruise/bleed easily.       Negative for hirsutism   Psychiatric/Behavioral: Negative for depression. The patient is not nervous/anxious and does not have insomnia.    Breast: no masses or tenderness  Past Medical History:  Past Medical History:  Diagnosis Date  . BRCA negative    family hx BRCA2+; pt is BRCA neg; at population risk (13%)   . FH: BRCA2 gene positive   . Hypertension   . Obesity (BMI 35.0-39.9 without comorbidity)    BMI 41 on 11/29/2017  . Pregnancy induced hypertension     Past Surgical History:  Past Surgical History:  Procedure Laterality Date  . CESAREAN SECTION  10/16/07; 01/24/2010; 11/20/2011   Medications: Camila and Current Outpatient Medications on File Prior to Visit  Medication Sig Dispense Refill  . amLODipine (NORVASC) 5 MG tablet Take 1 tablet (5 mg total) by mouth daily. 90 tablet 3  . citalopram (CELEXA) 20 MG tablet Take 1 tablet (20 mg total) by mouth daily. 90 tablet 3  . Multiple Vitamin (MULTIVITAMIN) tablet Take 1 tablet by mouth daily.    .       No current facility-administered medications on file prior to visit.  Vitamin C 1000 mgm daily Allergies:  No Known Allergies  Gynecologic History:  Obstetric History: [id number] Cesarean sections x 3 OB History  Gravida Para Term Preterm AB Living  _0 SAB TAB Ectopic Multiple Live Births          3    # Outcome Date GA Lbr Len/2nd Weight Sex Delivery Anes PTL Lv  3 Term 11/20/11 [redacted]w[redacted]d 9 lb 2 oz (4.139 kg) M CS-LTranv   LIV  2 Term 01/24/10 321w0d9 lb 6 oz (4.252 kg) F CS-LTranv   LIV  1 Term 10/16/07 3935w0d lb 8 oz  (4.309 kg) M CS-LTranv   LIV     Complications: Failure to Progress in First Stage, Cephalopelvic Disproportion, Gestational hypertension   Social History:  Social History   Socioeconomic History  . Marital status: Married    Spouse name: JohJenny Reichmann Number of children: 3  . Years of education: 16  . Highest education level: Bachelor's degree (e.g., BA, AB, BS)  Occupational History  . Occupation: TeaPharmacist, hospital Comment: [employer]  . Smoking status: Former Smoker    Types: Cigarettes    Quit date: 05/21/2005    Years since quitting: 14.5  . Smokeless tobacco: Never Used  . Tobacco comment: former social smoker  Vaping Use  . Vaping Use: Never used  Substance and Sexual Activity  . Alcohol use: Yes    Comment: drinks wine about 2 times per month  . Drug use: No  . Sexual activity: Yes    Partners: Male    Birth control/protection: Pill  Other Topics Concern  . Not on file  Social History Narrative  . Not on file   Social Determinants of Health   Financial Resource Strain:   . Difficulty of Paying Living Expenses:   Food Insecurity:   . Worried About RunCharity fundraiser the Last Year:   . RanArboriculturist the Last Year:   Transportation Needs:   . LacFilm/video editoredical):   . LMarland Kitchenck of Transportation (Non-Medical):   Physical Activity:   . Days of Exercise per Week:   . Minutes of Exercise per Session:   Stress:   . Feeling of Stress :   Social Connections:   . Frequency of Communication with Friends and Family:   . Frequency of Social Gatherings with Friends and Family:   . Attends Religious Services:   . Active Member of Clubs or Organizations:   . Attends CluArchivistetings:   . MMarland Kitchenrital Status:   Intimate Partner Violence:   . Fear of Current or Ex-Partner:   . Emotionally Abused:   . PMarland Kitchenysically Abused:   . Sexually Abused:     Family History:  Family History  Problem Relation Age of Onset  . Breast cancer  Mother 39 60 Hypertension Father   . Seizures Sister   . Breast cancer Maternal Aunt 45 65    +BRCA 2/ daughter also +BRCA2  . Heart failure Maternal Aunt   . Cervical cancer Paternal Grandmother 80 46 Heart failure Paternal Grandfather   . BRCA 1/2 Cousin        BRCA 2 maternal side.   . Breast cancer Other   . Colon cancer Neg Hx   . Ovarian cancer Neg Hx   . Diabetes Neg Hx  Physical Exam Vitals: BP 120/80   Pulse 67   Ht 5' 7.5" (1.715 m)   Wt (!) 252 lb (114.3 kg)   LMP 12/04/2019 (Exact Date)   BMI 38.89 kg/m  Physical Exam Constitutional:      Comments: Obese WF, pleasant, in NAD  HENT:     Head: Normocephalic and atraumatic.  Neck:     Thyroid: No thyromegaly.  Cardiovascular:     Rate and Rhythm: Normal rate and regular rhythm.     Heart sounds: No murmur heard.   Pulmonary:     Effort: Pulmonary effort is normal.     Breath sounds: Normal breath sounds. No wheezing or rales.  Chest:     Breasts:        Right: No inverted nipple, mass, nipple discharge, skin change or tenderness.        Left: No inverted nipple, mass, nipple discharge, skin change or tenderness.  Abdominal:     General: There is no distension.     Palpations: Abdomen is soft. There is no mass.     Tenderness: There is no abdominal tenderness. There is no guarding or rebound.  Genitourinary:    Rectum: Normal.     Comments: Vulva: no lesions or inflammation Vagina: no lesions, on menses Cervix: no lesions, closed Uterus: nonenlarged, normal contour Position: anteverted   mobile, non-tender Adnexa: no masses, NT. Exam limited due to body habitus.  Musculoskeletal:        General: Normal range of motion.     Cervical back: Normal range of motion and neck supple.  Lymphadenopathy:     Cervical: No cervical adenopathy.  Skin:    General: Skin is warm and dry.     Findings: No rash.  Neurological:     Mental Status: She is alert and oriented to person, place, and time.    Psychiatric:        Behavior: Behavior normal.        Thought Content: Thought content normal.        Judgment: Judgment normal.      Assessment: 39 y.o. [id number] well woman exam Contraceptive management Hypertension Family history of breast cancer   Plan:  1) Contraception: refill minipill x 1 year  2) Cervical cancer screening: Pap not done. Patient desires Pap smears every 3 years.  3) Breast cancer screening: recommend monthly SBE. Continue annual screening mammograms  4) Routine health care maintenance: per PCP  5) RTO 1 year and prn  Dalia Heading, [place], [place]  12/17/2019 1:41 PM

## 2020-02-26 ENCOUNTER — Encounter: Payer: BC Managed Care – PPO | Admitting: Family Medicine

## 2020-03-08 ENCOUNTER — Ambulatory Visit (INDEPENDENT_AMBULATORY_CARE_PROVIDER_SITE_OTHER): Payer: BC Managed Care – PPO | Admitting: Family Medicine

## 2020-03-08 ENCOUNTER — Other Ambulatory Visit: Payer: Self-pay

## 2020-03-08 ENCOUNTER — Encounter: Payer: Self-pay | Admitting: Family Medicine

## 2020-03-08 VITALS — BP 127/83 | HR 66 | Temp 98.3°F | Ht 67.5 in | Wt 259.0 lb

## 2020-03-08 DIAGNOSIS — H6993 Unspecified Eustachian tube disorder, bilateral: Secondary | ICD-10-CM

## 2020-03-08 DIAGNOSIS — Z1159 Encounter for screening for other viral diseases: Secondary | ICD-10-CM | POA: Diagnosis not present

## 2020-03-08 DIAGNOSIS — I10 Essential (primary) hypertension: Secondary | ICD-10-CM | POA: Diagnosis not present

## 2020-03-08 DIAGNOSIS — F411 Generalized anxiety disorder: Secondary | ICD-10-CM

## 2020-03-08 DIAGNOSIS — Z23 Encounter for immunization: Secondary | ICD-10-CM

## 2020-03-08 DIAGNOSIS — Z0000 Encounter for general adult medical examination without abnormal findings: Secondary | ICD-10-CM | POA: Diagnosis not present

## 2020-03-08 DIAGNOSIS — G4700 Insomnia, unspecified: Secondary | ICD-10-CM | POA: Insufficient documentation

## 2020-03-08 DIAGNOSIS — H6983 Other specified disorders of Eustachian tube, bilateral: Secondary | ICD-10-CM

## 2020-03-08 MED ORDER — TRAZODONE HCL 50 MG PO TABS
25.0000 mg | ORAL_TABLET | Freq: Every evening | ORAL | 3 refills | Status: DC | PRN
Start: 2020-03-08 — End: 2021-06-01

## 2020-03-08 MED ORDER — FLUTICASONE PROPIONATE 50 MCG/ACT NA SUSP: 2.0000 | Freq: Every day | NASAL | 6 refills | Status: DC

## 2020-03-08 NOTE — Assessment & Plan Note (Signed)
BMI 39 and associated with HTN Discussed importance of healthy weight management Discussed diet and exercise

## 2020-03-08 NOTE — Progress Notes (Signed)
Complete physical exam   Patient: Elizabeth Stevens   DOB: May 02, 1981   39 y.o. Female  MRN: [id number] Visit Date: 03/08/2020  Today's healthcare provider: [name], MD   Chief Complaint  Patient presents with  . Annual Exam   Subjective    Elizabeth Stevens is a 39 y.o. female who presents today for a complete physical exam.  She reports consuming a general diet. Exercises regularly She generally feels well. She reports sleeping fairly well. She does have additional problems to discuss today.  HPI   Ears have been popping.  She would like a RX for Flonase.  Trouble staying asleep at night. Sometimes with nocturia.  Ongoing since school started back.  Never a problem before. No snoring. No worrying. Non-restorative sleep. Occasional morning headache.   Past Medical History:  Diagnosis Date  . BRCA negative    family hx BRCA2+; pt is BRCA neg; at population risk (13%)   . FH: BRCA2 gene positive   . Hypertension   . Obesity (BMI 35.0-39.9 without comorbidity)    BMI 41 on 11/29/2017  . Pregnancy induced hypertension    Past Surgical History:  Procedure Laterality Date  . CESAREAN SECTION  10/16/07; 01/24/2010; 11/20/2011   Social History   Socioeconomic History  . Marital status: Married    Spouse name: Jenny Reichmann  . Number of children: 3  . Years of education: 16  . Highest education level: Bachelor's degree (e.g., BA, AB, BS)  Occupational History  . Occupation: Pharmacist, hospital    Comment: [employer]  Tobacco Use  . Smoking status: Former Smoker    Types: Cigarettes    Quit date: 05/21/2005    Years since quitting: 14.8  . Smokeless tobacco: Never Used  . Tobacco comment: former social smoker  Vaping Use  . Vaping Use: Never used  Substance and Sexual Activity  . Alcohol use: Yes    Comment: drinks wine about 2 times per month  . Drug use: No  . Sexual activity: Yes    Partners: Male    Birth control/protection: Pill  Other Topics Concern  .  Not on file  Social History Narrative  . Not on file   Social Determinants of Health   Financial Resource Strain:   . Difficulty of Paying Living Expenses: Not on file  Food Insecurity:   . Worried About Charity fundraiser in the Last Year: Not on file  . Ran Out of Food in the Last Year: Not on file  Transportation Needs:   . Lack of Transportation (Medical): Not on file  . Lack of Transportation (Non-Medical): Not on file  Physical Activity:   . Days of Exercise per Week: Not on file  . Minutes of Exercise per Session: Not on file  Stress:   . Feeling of Stress : Not on file  Social Connections:   . Frequency of Communication with Friends and Family: Not on file  . Frequency of Social Gatherings with Friends and Family: Not on file  . Attends Religious Services: Not on file  . Active Member of Clubs or Organizations: Not on file  . Attends Archivist Meetings: Not on file  . Marital Status: Not on file  Intimate Partner Violence:   . Fear of Current or Ex-Partner: Not on file  . Emotionally Abused: Not on file  . Physically Abused: Not on file  . Sexually Abused: Not on file   Family Status  Relation Name  Status  . Mother  Deceased  . Father  Alive  . Sister  Alive  . Mat [employer]  . PGM  Deceased  . PGF  Deceased  . Cousin  Alive  . Other mat great aunt Deceased  . MGM  Alive  . MGF  Deceased  . Neg Hx  (Not Specified)   Family History  Problem Relation Age of Onset  . Breast cancer Mother 69  . Hypertension Father   . Seizures Sister   . Breast cancer Maternal Aunt 54       +BRCA 2/ daughter also +BRCA2  . Heart failure Maternal Aunt   . Cervical cancer Paternal Grandmother 20  . Heart failure Paternal Grandfather   . BRCA 1/2 Cousin        BRCA 2 maternal side.   . Breast cancer Other   . Parkinson's disease Maternal Grandfather   . Colon cancer Neg Hx   . Ovarian cancer Neg Hx   . Diabetes Neg Hx    No Known Allergies  Patient Care  Team: Virginia Crews, MD as PCP - General (Family Medicine)   Medications: Outpatient Medications Prior to Visit  Medication Sig  . amLODipine (NORVASC) 5 MG tablet TAKE 1 TABLET(5 MG) BY MOUTH DAILY  . Ascorbic Acid (VITAMIN C) 1000 MG tablet Take 1,000 mg by mouth daily.  . cholecalciferol (VITAMIN D3) 25 MCG (1000 UNIT) tablet Take 1,000 Units by mouth daily.  . citalopram (CELEXA) 40 MG tablet Take 1 tablet (40 mg total) by mouth daily.  . Multiple Vitamin (MULTIVITAMIN) tablet Take 1 tablet by mouth daily.  . norethindrone (MICRONOR) 0.35 MG tablet Take 1 tablet (0.35 mg total) by mouth daily.   No facility-administered medications prior to visit.    Review of Systems  Constitutional: Negative.   HENT: Positive for ear pain. Negative for congestion, dental problem, drooling, ear discharge, facial swelling, hearing loss, mouth sores, nosebleeds, postnasal drip, rhinorrhea, sinus pressure, sinus pain, sneezing, sore throat, tinnitus, trouble swallowing and voice change.   Eyes: Negative.   Respiratory: Negative.   Cardiovascular: Negative.   Gastrointestinal: Negative.   Endocrine: Negative.   Genitourinary: Negative.   Musculoskeletal: Negative.   Skin: Negative.   Allergic/Immunologic: Negative.   Neurological: Negative.   Hematological: Negative.   Psychiatric/Behavioral: Negative.       Objective    BP 127/83 (BP Location: Right Arm, Patient Position: Sitting, Cuff Size: Large)   Pulse 66   Temp 98.3 F (36.8 C) (Oral)   Ht 5' 7.5" (1.715 m)   Wt 259 lb (117.5 kg)   BMI 39.97 kg/m    Physical Exam Vitals reviewed.  Constitutional:      General: She is not in acute distress.    Appearance: Normal appearance. She is well-developed. She is not diaphoretic.  HENT:     Head: Normocephalic and atraumatic.     Right Ear: Tympanic membrane, ear canal and external ear normal.     Left Ear: Tympanic membrane, ear canal and external ear normal.  Eyes:      General: No scleral icterus.    Conjunctiva/sclera: Conjunctivae normal.     Pupils: Pupils are equal, round, and reactive to light.  Neck:     Thyroid: No thyromegaly.  Cardiovascular:     Rate and Rhythm: Normal rate and regular rhythm.     Pulses: Normal pulses.     Heart sounds: Normal heart sounds. No murmur heard.   Pulmonary:  Effort: Pulmonary effort is normal. No respiratory distress.     Breath sounds: Normal breath sounds. No wheezing or rales.  Abdominal:     General: There is no distension.     Palpations: Abdomen is soft.     Tenderness: There is no abdominal tenderness.  Musculoskeletal:        General: No deformity.     Cervical back: Neck supple.     Right lower leg: No edema.     Left lower leg: No edema.  Lymphadenopathy:     Cervical: No cervical adenopathy.  Skin:    General: Skin is warm and dry.     Findings: No rash.  Neurological:     Mental Status: She is alert and oriented to person, place, and time. Mental status is at baseline.     Gait: Gait normal.  Psychiatric:        Mood and Affect: Mood normal.        Behavior: Behavior normal.        Thought Content: Thought content normal.       Last depression screening scores PHQ 2/9 Scores 03/08/2020 08/21/2019 02/20/2019  PHQ - 2 Score 0 1 0  PHQ- 9 Score _0 Last fall risk screening Fall Risk  03/08/2020  Falls in the past year? 0  Number falls in past yr: 0  Injury with Fall? 0  Risk for fall due to : No Fall Risks  Follow up Falls evaluation completed   Last Audit-C alcohol use screening Alcohol Use Disorder Test (AUDIT) 03/08/2020  1. How often do you have a drink containing alcohol? 2  2. How many drinks containing alcohol do you have on a typical day when you are drinking? 1  3. How often do you have six or more drinks on one occasion? 0  AUDIT-C Score 3   A score of 3 or more in women, and 4 or more in men indicates increased risk for alcohol abuse, EXCEPT if all of the  points are from question 1   No results found for any visits on 03/08/20.  Assessment & Plan    Routine Health Maintenance and Physical Exam  Exercise Activities and Dietary recommendations Goals   None     Immunization History  Administered Date(s) Administered  . Influenza,inj,Quad PF,6+ Mos 02/20/2019, 03/08/2020  . Moderna SARS-COVID-2 Vaccination 07/18/2019, 08/15/2019  . Tdap 02/20/2019    Health Maintenance  Topic Date Due  . Hepatitis C Screening  Never done  . MAMMOGRAM  11/05/2021  . PAP SMEAR-Modifier  12/10/2021  . TETANUS/TDAP  02/19/2029  . INFLUENZA VACCINE  Completed  . COVID-19 Vaccine  Completed  . HIV Screening  Completed    Discussed health benefits of physical activity, and encouraged her to engage in regular exercise appropriate for her age and condition.  Problem List Items Addressed This Visit      Cardiovascular and Mediastinum   Essential hypertension    Well controlled Continue current medications Recheck metabolic panel F/u in 6 months       Relevant Orders   Comprehensive metabolic panel     Nervous and Auditory   Dysfunction of both eustachian tubes    New problem Normal ear exam today Start flonase        Other   Morbid obesity ([place])    BMI 39 and associated with HTN Discussed importance of healthy weight management Discussed diet and exercise       Relevant  Orders   Lipid panel   Comprehensive metabolic panel   TSH   CBC w/Diff/Platelet   GAD (generalized anxiety disorder)    Chronic and well controlled Continue celexa 42m daily Have discussed synergism with therapy and medications F/u in 6 month - repat PHQ9 and GAD7      Relevant Medications   traZODone (DESYREL) 50 MG tablet   Insomnia    New problem Seems stress related 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 Patient does have some risk factors for OSA, but declines sleep study at this time Trial of trazodone prn Encourage  mindfulness and stress reduction techniques       Other Visit Diagnoses    Encounter for annual physical exam    -  Primary   Relevant Orders   Hepatitis C Antibody   Lipid panel   Comprehensive metabolic panel   TSH   CBC w/Diff/Platelet   Need for influenza vaccination       Relevant Orders   Flu Vaccine QUAD 6+ mos PF IM (Fluarix Quad PF) (Completed)   Need for hepatitis C screening test       Relevant Orders   Hepatitis C Antibody       Return in about 6 months (around 09/06/2020) for chronic disease f/u.     I, ALavon Paganini MD, have reviewed all documentation for this visit. The documentation on 03/08/20 for the exam, diagnosis, procedures, and orders are all accurate and complete.   Royalty Fakhouri, ADionne Bucy MD, MPH [facility]

## 2020-03-08 NOTE — Assessment & Plan Note (Signed)
Chronic and well controlled Continue celexa 40mg  daily Have discussed synergism with therapy and medications F/u in 6 month - repat PHQ9 and GAD7

## 2020-03-08 NOTE — Assessment & Plan Note (Signed)
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

## 2020-03-08 NOTE — Assessment & Plan Note (Signed)
Well controlled Continue current medications Recheck metabolic panel F/u in 6 months  

## 2020-03-08 NOTE — Patient Instructions (Signed)

## 2020-03-08 NOTE — Assessment & Plan Note (Signed)
New problem Normal ear exam today Start flonase

## 2020-03-09 LAB — COMPREHENSIVE METABOLIC PANEL
ALT: 13 IU/L (ref 0–32)
AST: 17 IU/L (ref 0–40)
Albumin/Globulin Ratio: 1.6 (ref 1.2–2.2)
Albumin: 4 g/dL (ref 3.8–4.8)
Alkaline Phosphatase: 57 IU/L (ref 44–121)
BUN/Creatinine Ratio: 13 (ref 9–23)
BUN: 10 mg/dL (ref 6–24)
Bilirubin Total: 0.4 mg/dL (ref 0.0–1.2)
CO2: 23 mmol/L (ref 20–29)
Calcium: 9.4 mg/dL (ref 8.7–10.2)
Chloride: 105 mmol/L (ref 96–106)
Creatinine, Ser: 0.79 mg/dL (ref 0.57–1.00)
GFR calc Af Amer: 108 mL/min/{1.73_m2} (ref >59)
GFR calc non Af Amer: 93 mL/min/{1.73_m2} (ref >59)
Globulin, Total: 2.5 g/dL (ref 1.5–4.5)
Glucose: 86 mg/dL (ref 65–99)
Potassium: 4.4 mmol/L (ref 3.5–5.2)
Sodium: 139 mmol/L (ref 134–144)
Total Protein: 6.5 g/dL (ref 6.0–8.5)

## 2020-03-09 LAB — CBC WITH DIFFERENTIAL/PLATELET
Basophils Absolute: 0.1 10*3/uL (ref 0.0–0.2)
Basos: 1 %
EOS (ABSOLUTE): 0.2 10*3/uL (ref 0.0–0.4)
Eos: 3 %
Hematocrit: 40.4 % (ref 34.0–46.6)
Hemoglobin: 14.1 g/dL (ref 11.1–15.9)
Immature Grans (Abs): 0 10*3/uL (ref 0.0–0.1)
Immature Granulocytes: 0 %
Lymphocytes Absolute: 2.1 10*3/uL (ref 0.7–3.1)
Lymphs: 26 %
MCH: 32.2 pg (ref 26.6–33.0)
MCHC: 34.9 g/dL (ref 31.5–35.7)
MCV: 92 fL (ref 79–97)
Monocytes Absolute: 0.5 10*3/uL (ref 0.1–0.9)
Monocytes: 6 %
Neutrophils Absolute: 5.3 10*3/uL (ref 1.4–7.0)
Neutrophils: 64 %
Platelets: 323 10*3/uL (ref 150–450)
RBC: 4.38 x10E6/uL (ref 3.77–5.28)
RDW: 12.1 % (ref 11.7–15.4)
WBC: 8.2 10*3/uL (ref 3.4–10.8)

## 2020-03-09 LAB — LIPID PANEL
Chol/HDL Ratio: 3.9 ratio (ref 0.0–4.4)
Cholesterol, Total: 206 mg/dL — ABNORMAL HIGH (ref 100–199)
HDL: 53 mg/dL (ref >39)
LDL Chol Calc (NIH): 138 mg/dL — ABNORMAL HIGH (ref 0–99)
Triglycerides: 84 mg/dL (ref 0–149)
VLDL Cholesterol Cal: 15 mg/dL (ref 5–40)

## 2020-03-09 LAB — HEPATITIS C ANTIBODY: Hep C Virus Ab: 0.1 s/co ratio (ref 0.0–0.9)

## 2020-03-09 LAB — TSH: TSH: 2.18 u[IU]/mL (ref 0.450–4.500)

## 2020-03-15 ENCOUNTER — Telehealth: Payer: Self-pay

## 2020-03-15 NOTE — Telephone Encounter (Signed)
-----   Message from Erasmo Downer, MD sent at 03/10/2020  9:29 AM EDT ----- Normal labs, except stable elevated cholesterol.  The 10-year ASCVD (heart disease/stroke) risk score Denman George DC Jr., et al., 2013) is: 0.9%, which is low.  No need for medications, but recommend diet low in saturated fat and regular exercise - 30 min at least 5 times per week.

## 2020-03-15 NOTE — Telephone Encounter (Signed)
Written by Erasmo Downer, MD on 03/10/2020 9:29 AM EDT Seen by patient Elizabeth Stevens on 03/13/2020 11:22 PM

## 2020-03-17 ENCOUNTER — Encounter: Payer: Self-pay | Admitting: Family Medicine

## 2020-05-02 ENCOUNTER — Encounter: Payer: Self-pay | Admitting: Family Medicine

## 2020-05-02 DIAGNOSIS — I10 Essential (primary) hypertension: Secondary | ICD-10-CM

## 2020-05-03 MED ORDER — AMLODIPINE BESYLATE 5 MG PO TABS: ORAL_TABLET | ORAL | 3 refills | Status: DC

## 2020-05-03 NOTE — Telephone Encounter (Signed)
Yes please

## 2020-09-01 ENCOUNTER — Telehealth: Payer: Self-pay | Admitting: Family Medicine

## 2020-09-01 MED ORDER — CITALOPRAM HYDROBROMIDE 40 MG PO TABS
40.0000 mg | ORAL_TABLET | Freq: Every day | ORAL | 3 refills | Status: DC
Start: 2020-09-01 — End: 2021-08-28

## 2020-09-01 NOTE — Telephone Encounter (Signed)
[facility] faxed refill request for the following medications:  citalopram (CELEXA) 40 MG tablet  Last Rx: 08/21/19 LOV: 03/08/20 NOV: 09/05/20 with Dr. Leonard Schwartz Please advise. Thanks TNP

## 2020-09-02 ENCOUNTER — Ambulatory Visit: Payer: BC Managed Care – PPO | Admitting: Family Medicine

## 2020-09-05 ENCOUNTER — Ambulatory Visit: Payer: BC Managed Care – PPO | Admitting: Family Medicine

## 2020-09-22 ENCOUNTER — Other Ambulatory Visit: Payer: Self-pay | Admitting: Family Medicine

## 2020-09-22 DIAGNOSIS — Z1231 Encounter for screening mammogram for malignant neoplasm of breast: Secondary | ICD-10-CM

## 2020-09-30 ENCOUNTER — Telehealth: Payer: Self-pay

## 2020-09-30 NOTE — Telephone Encounter (Signed)
LMTCB to Reschedule appointment from May 23 to another day.

## 2020-10-10 ENCOUNTER — Ambulatory Visit: Payer: BC Managed Care – PPO | Admitting: Family Medicine

## 2020-11-07 ENCOUNTER — Ambulatory Visit
Admission: RE | Admit: 2020-11-07 | Discharge: 2020-11-07 | Disposition: A | Discharge status: Home / Self Care | Payer: BC Managed Care – PPO | Source: Ambulatory Visit | Attending: Family Medicine | Admitting: Family Medicine

## 2020-11-07 ENCOUNTER — Other Ambulatory Visit: Payer: Self-pay

## 2020-11-07 DIAGNOSIS — Z1231 Encounter for screening mammogram for malignant neoplasm of breast: Secondary | ICD-10-CM | POA: Diagnosis not present

## 2020-12-08 ENCOUNTER — Other Ambulatory Visit: Payer: Self-pay | Admitting: Family Medicine

## 2020-12-08 NOTE — Telephone Encounter (Signed)
Requested medication (s) are due for refill today: yes  Requested medication (s) are on the active medication list: yes  Last refill:  12/17/19 #84 3 refills  Future visit scheduled: yes in 3 months   Notes to clinic:  medication last ordered by Farrel Conners, CNM. Patient requesting refill until seen in 3 months due to previous provider OBGYN retiring . Do you want to refill?     Requested Prescriptions  Pending Prescriptions Disp Refills   norethindrone (MICRONOR) 0.35 MG tablet 84 tablet 3    Sig: Take 1 tablet (0.35 mg total) by mouth daily.      OB/GYN: Contraceptives - Progestins Passed - 12/08/2020  1:50 PM      Passed - Valid encounter within last 12 months    Recent Outpatient Visits           9 months ago Encounter for annual physical exam   [facility], Marzella Schlein, MD   1 year ago Essential hypertension   [facility] [place], Marzella Schlein, MD   1 year ago Encounter for annual physical exam   [facility], Marzella Schlein, MD   2 years ago Galactorrhea   [facility], Marzella Schlein, MD   2 years ago Essential hypertension   [facility] [place], Marzella Schlein, MD       Future Appointments             In 3 months [place], Marzella Schlein, MD [place], [facility]

## 2020-12-08 NOTE — Telephone Encounter (Signed)
Medication Refill - Medication: Norethindrone   Has the patient contacted their pharmacy? No. Pt states that almost out of this medication. She states that her OBGYN is retiring and that she is needing to have this refilled until her appt in October. Please advise . (Agent: If no, request that the patient contact the pharmacy for the refill.) (Agent: If yes, when and what did the pharmacy advise?)  Preferred Pharmacy (with phone number or street name):  [id number] [place], [place] - [address] AT [facility] OF [address]  [address] [place] [address]  Phone: [phone] Fax: [phone]  Hours: Not open 24 hours    Agent: Please be advised that RX refills may take up to 3 business days. We ask that you follow-up with your pharmacy.

## 2020-12-09 MED ORDER — NORETHINDRONE 0.35 MG PO TABS: 1.0000 | ORAL_TABLET | Freq: Every day | ORAL | 3 refills | Status: DC

## 2021-01-30 ENCOUNTER — Other Ambulatory Visit: Payer: Self-pay

## 2021-01-30 ENCOUNTER — Telehealth (INDEPENDENT_AMBULATORY_CARE_PROVIDER_SITE_OTHER): Payer: BC Managed Care – PPO | Admitting: Family Medicine

## 2021-01-30 ENCOUNTER — Encounter: Payer: Self-pay | Admitting: Family Medicine

## 2021-01-30 DIAGNOSIS — H6982 Other specified disorders of Eustachian tube, left ear: Secondary | ICD-10-CM

## 2021-01-30 DIAGNOSIS — H9202 Otalgia, left ear: Secondary | ICD-10-CM

## 2021-01-30 MED ORDER — AMOXICILLIN 875 MG PO TABS: 875.0000 mg | ORAL_TABLET | Freq: Two times a day (BID) | ORAL | 0 refills | Status: AC

## 2021-01-30 NOTE — Progress Notes (Signed)
MyChart Video Visit    Virtual Visit via Video Note   This visit type was conducted due to national recommendations for restrictions regarding the COVID-19 Pandemic (e.g. social distancing) in an effort to limit this patient's exposure and mitigate transmission in our community. This patient is at least at moderate risk for complications without adequate follow up. This format is felt to be most appropriate for this patient at this time. Physical exam was limited by quality of the video and audio technology used for the visit.   Patient location: At work Provider location: Office  I discussed the limitations of evaluation and management by telemedicine and the availability of in person appointments. The patient expressed understanding and agreed to proceed.  Patient: Elizabeth Stevens   DOB: 1980/12/19   40 y.o. Female  MRN: [id number] Visit Date: 01/30/2021  Today's healthcare provider: Vernie Murders, PA-C   No chief complaint on file.  Subjective    HPI  Patient reports ear pain in left ear. Last Tuesday is when pain began. Patient says it started on back side of ear if she touched it but by later of that week entire ear in constant pain. Saturday morning she noticed inside of ear began scabbing right above her ear canal. Patient takes tylenol for pain and it gives mild relief, would describe it as an aching pain. Feels like her glands on the left side are swollen as well.  Itching  began yesterday.  Past Medical History:  Diagnosis Date   BRCA negative    family hx BRCA2+; pt is BRCA neg; at population risk (13%)    FH: BRCA2 gene positive    Hypertension    Obesity (BMI 35.0-39.9 without comorbidity)    BMI 41 on 11/29/2017   Pregnancy induced hypertension    Past Surgical History:  Procedure Laterality Date   CESAREAN SECTION  10/16/07; 01/24/2010; 11/20/2011   Family History  Problem Relation Age of Onset   Breast cancer Mother 59   Hypertension Father    Seizures  Sister    Breast cancer Maternal Aunt 44       +BRCA 2/ daughter also +BRCA2   Heart failure Maternal Aunt    Cervical cancer Paternal Grandmother 1   Heart failure Paternal Grandfather    BRCA 1/2 Cousin        BRCA 2 maternal side.    Breast cancer Other    Parkinson's disease Maternal Grandfather    Colon cancer Neg Hx    Ovarian cancer Neg Hx    Diabetes Neg Hx    Social History   Tobacco Use   Smoking status: Former    Types: Cigarettes    Quit date: 05/21/2005    Years since quitting: 15.7   Smokeless tobacco: Never   Tobacco comments:    former social smoker  Vaping Use   Vaping Use: Never used  Substance Use Topics   Alcohol use: Yes    Comment: drinks wine about 2 times per month   Drug use: No   No Known Allergies  Medications: Outpatient Medications Prior to Visit  Medication Sig   amLODipine (NORVASC) 5 MG tablet TAKE 1 TABLET(5 MG) BY MOUTH DAILY   Ascorbic Acid (VITAMIN C) 1000 MG tablet Take 1,000 mg by mouth daily.   cholecalciferol (VITAMIN D3) 25 MCG (1000 UNIT) tablet Take 1,000 Units by mouth daily.   citalopram (CELEXA) 40 MG tablet Take 1 tablet (40 mg total) by mouth daily.  fluticasone (FLONASE) 50 MCG/ACT nasal spray Place 2 sprays into both nostrils daily.   Multiple Vitamin (MULTIVITAMIN) tablet Take 1 tablet by mouth daily.   norethindrone (MICRONOR) 0.35 MG tablet Take 1 tablet (0.35 mg total) by mouth daily.   traZODone (DESYREL) 50 MG tablet Take 0.5-1 tablets (25-50 mg total) by mouth at bedtime as needed for sleep.   No facility-administered medications prior to visit.    Review of Systems  HENT:  Positive for ear pain.   All other systems reviewed and are negative.    Objective    There were no vitals taken for this visit.   Physical Exam: WDWN female in no apparent distress.  Head: Normocephalic, atraumatic. Neck: Supple, NROM Respiratory: No apparent distress Psych: Normal mood and affect Ear: Questionable redness to  the left auricle.    Assessment & Plan     1. Left ear pain  Developed ache in the left ear on 01-24-21 with some rhinorrhea and popping with swallowing. No fever, ear drainage, sore throat, cough or loss of taste. On 01-28-21, she noticed some "scabs" in the left auricle but no scalp rashes. Some soreness along the left anterior cervical node chain. Will treat with antibiotic and continue antihistamine prn. Monitor for COVID symptoms and test at home if suspicious. Warned about using another barrier contraceptive with her BCP while using this antibiotic. - amoxicillin (AMOXIL) 875 MG tablet; Take 1 tablet (875 mg total) by mouth 2 (two) times daily for 10 days.  Dispense: 20 tablet; Refill: 0  2. Dysfunction of left eustachian tube Slight scratchy throat and popping in the left ear with swallowing, History of some allergies. Should continue Flonase through the Fall allergy season and may add Zyrtec prn.   No follow-ups on file.     I discussed the assessment and treatment plan with the patient. The patient was provided an opportunity to ask questions and all were answered. The patient agreed with the plan and demonstrated an understanding of the instructions.   The patient was advised to call back or seek an in-person evaluation if the symptoms worsen or if the condition fails to improve as anticipated.  I provided 20 minutes of non-face-to-face time during this encounter.  I, Soledad Budreau, PA-C, have reviewed all documentation for this visit. The documentation on 01/30/21 for the exam, diagnosis, procedures, and orders are all accurate and complete.   Vernie Murders, PA-C [employer] [phone] (phone) [phone] (fax)  [place]

## 2021-03-13 ENCOUNTER — Encounter: Payer: BC Managed Care – PPO | Admitting: Family Medicine

## 2021-03-24 ENCOUNTER — Ambulatory Visit (INDEPENDENT_AMBULATORY_CARE_PROVIDER_SITE_OTHER): Payer: BC Managed Care – PPO | Admitting: Physician Assistant

## 2021-03-24 ENCOUNTER — Encounter: Payer: Self-pay | Admitting: Physician Assistant

## 2021-03-24 ENCOUNTER — Other Ambulatory Visit: Payer: Self-pay

## 2021-03-24 VITALS — BP 142/81 | HR 74 | Temp 97.8°F | Resp 16 | Ht 67.5 in | Wt 275.5 lb

## 2021-03-24 DIAGNOSIS — N3000 Acute cystitis without hematuria: Secondary | ICD-10-CM

## 2021-03-24 DIAGNOSIS — R399 Unspecified symptoms and signs involving the genitourinary system: Secondary | ICD-10-CM | POA: Diagnosis not present

## 2021-03-24 LAB — POCT URINALYSIS DIPSTICK
Bilirubin, UA: NEGATIVE
Glucose, UA: NEGATIVE
Ketones, UA: NEGATIVE
Nitrite, UA: NEGATIVE
Protein, UA: NEGATIVE
Spec Grav, UA: 1.01 (ref 1.010–1.025)
Urobilinogen, UA: 0.2 E.U./dL
pH, UA: 8 (ref 5.0–8.0)

## 2021-03-24 MED ORDER — NITROFURANTOIN MONOHYD MACRO 100 MG PO CAPS: 100.0000 mg | ORAL_CAPSULE | Freq: Two times a day (BID) | ORAL | 0 refills | Status: AC

## 2021-03-24 NOTE — Progress Notes (Signed)
Established patient visit   Patient: Elizabeth Stevens   DOB: 1980/08/29   40 y.o. Female  MRN: [id number] Visit Date: 03/24/2021  Today's healthcare provider: Alfredia Ferguson, PA-C   Chief Complaint  Patient presents with   urinary symptoms    Subjective    HPI  Urinary symptoms  She reports new onset cloudy malodorous urine, dysuria, hematuria, and urinary urgency. The current episode started a few days ago and is gradually improving. Patient states symptoms are mild in intensity, occurring constantly. She  has not been recently treated for similar symptoms. She has tried AZO since Wednesday    Associated symptoms: Yes abdominal pain Yes back pain  Yes chills No constipation  Yes cramping No diarrhea  Yes discharge No fever  Yes hematuria No nausea  No vomiting    ---------------------------------------------------------------------------------------     Medications: Outpatient Medications Prior to Visit  Medication Sig   amLODipine (NORVASC) 5 MG tablet TAKE 1 TABLET(5 MG) BY MOUTH DAILY   Ascorbic Acid (VITAMIN C) 1000 MG tablet Take 1,000 mg by mouth daily.   cholecalciferol (VITAMIN D3) 25 MCG (1000 UNIT) tablet Take 1,000 Units by mouth daily.   citalopram (CELEXA) 40 MG tablet Take 1 tablet (40 mg total) by mouth daily.   fluticasone (FLONASE) 50 MCG/ACT nasal spray Place 2 sprays into both nostrils daily.   Multiple Vitamin (MULTIVITAMIN) tablet Take 1 tablet by mouth daily.   norethindrone (MICRONOR) 0.35 MG tablet Take 1 tablet (0.35 mg total) by mouth daily.   traZODone (DESYREL) 50 MG tablet Take 0.5-1 tablets (25-50 mg total) by mouth at bedtime as needed for sleep.   No facility-administered medications prior to visit.    Review of Systems  Constitutional:  Negative for fatigue and fever.  Genitourinary:  Positive for dysuria and frequency. Negative for hematuria.  All other systems reviewed and are negative.     Objective    BP (!) 142/81  (BP Location: Right Arm, Patient Position: Sitting, Cuff Size: Large)   Pulse 74   Temp 97.8 F (36.6 C) (Oral)   Resp 16   Ht 5' 7.5" (1.715 m)   Wt 275 lb 8 oz (125 kg)   BMI 42.51 kg/m    Physical Exam Constitutional:      General: She is awake.     Appearance: She is well-developed.  HENT:     Head: Normocephalic.  Eyes:     Conjunctiva/sclera: Conjunctivae normal.  Cardiovascular:     Rate and Rhythm: Normal rate and regular rhythm.     Heart sounds: Normal heart sounds.  Pulmonary:     Effort: Pulmonary effort is normal.     Breath sounds: Normal breath sounds.  Abdominal:     General: There is no distension.     Palpations: Abdomen is soft. There is no mass.     Tenderness: There is no right CVA tenderness, left CVA tenderness or guarding.  Skin:    General: Skin is warm.  Neurological:     Mental Status: She is alert and oriented to person, place, and time.  Psychiatric:        Attention and Perception: Attention normal.        Mood and Affect: Mood normal.        Speech: Speech normal.        Behavior: Behavior is cooperative.     Results for orders placed or performed in visit on 03/24/21  POCT urinalysis dipstick  Result  Value Ref Range   Color, UA Yellow    Clarity, UA Cloudy    Glucose, UA Negative Negative   Bilirubin, UA Negative    Ketones, UA Negative    Spec Grav, UA 1.010 1.010 - 1.025   Blood, UA Trace    pH, UA 8.0 5.0 - 8.0   Protein, UA Negative Negative   Urobilinogen, UA 0.2 0.2 or 1.0 E.U./dL   Nitrite, UA Negative    Leukocytes, UA Small (1+) (A) Negative   Appearance     Odor      Assessment & Plan     UTI w/o hematuria Dip + leuk Macrobid x 5 days Will culture and f/u if changes need to be made  F/u as scheduled for cpe     I, Mikey Kirschner, PA-C have reviewed all documentation for this visit. The documentation on  03/24/2021 for the exam, diagnosis, procedures, and orders are all accurate and complete.    Mikey Kirschner, PA-C  [facility] [phone] (phone) [phone] (fax)  [place]

## 2021-03-29 LAB — URINE CULTURE

## 2021-03-29 LAB — SPECIMEN STATUS REPORT

## 2021-03-31 ENCOUNTER — Ambulatory Visit: Payer: BC Managed Care – PPO | Admitting: Family Medicine

## 2021-05-09 ENCOUNTER — Encounter: Payer: BC Managed Care – PPO | Admitting: Physician Assistant

## 2021-05-31 ENCOUNTER — Ambulatory Visit: Payer: Self-pay

## 2021-05-31 ENCOUNTER — Encounter: Payer: Self-pay | Admitting: Family Medicine

## 2021-05-31 NOTE — Progress Notes (Signed)
Established patient visit   Patient: Elizabeth Stevens   DOB: [birth year]   41 y.o. Female  MRN: [id number] Visit Date: 06/01/2021  Today's healthcare provider: [name], MD   Chief Complaint  Patient presents with   Anxiety   I,Sulibeya S Dimas,acting as a scribe for [name], MD.,have documented all relevant documentation on the behalf of [name], MD,as directed by  [name], MD while in the presence of [name], MD.  Subjective    HPI  Anxiety, Follow-up  She was last seen for anxiety 2 months ago. Changes made at last visit include continue Celexa 40 mg daily.   She reports excellent compliance with treatment. She reports excellent tolerance of treatment. She is not having side effects.   She feels her anxiety is moderate and Worse since last visit.  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 Feels like celexa is not enough for the first time  Symptoms: No chest pain No difficulty concentrating  No dizziness Yes fatigue  No feelings of losing control No insomnia  Yes irritable No palpitations  No panic attacks No racing thoughts  No shortness of breath No sweating  No tremors/shakes    GAD-7 Results GAD-7 Generalized Anxiety Disorder Screening Tool 06/01/2021 08/21/2019 12/03/2017  1. Feeling Nervous, Anxious, or on Edge 1 0 3  2. Not Being Able to Stop or Control Worrying 1 0 1  3. Worrying Too Much About Different Things 0 0 0  4. Trouble Relaxing 0 0 1  5. Being So Restless it's Hard To Sit Still 0 0 0  6. Becoming Easily Annoyed or Irritable 1 1 2   7. Feeling Afraid As If Something Awful Might Happen 0 0 0  Total GAD-7 Score 3 1 7   Difficulty At Work, Home, or Getting  Along With Others? Somewhat difficult Not difficult at all Somewhat difficult  Feels like this does not accurately reflect your current situation  PHQ-9 Scores PHQ9  SCORE ONLY 06/01/2021 03/08/2020 08/21/2019  PHQ-9 Total Score 4 2 3     ---------------------------------------------------------------------------------------------------   Medications: Outpatient Medications Prior to Visit  Medication Sig   amLODipine (NORVASC) 5 MG tablet TAKE 1 TABLET(5 MG) BY MOUTH DAILY   Ascorbic Acid (VITAMIN C) 1000 MG tablet Take 1,000 mg by mouth daily.   cholecalciferol (VITAMIN D3) 25 MCG (1000 UNIT) tablet Take 1,000 Units by mouth daily.   citalopram (CELEXA) 40 MG tablet Take 1 tablet (40 mg total) by mouth daily.   fluticasone (FLONASE) 50 MCG/ACT nasal spray Place 2 sprays into both nostrils daily.   Multiple Vitamin (MULTIVITAMIN) tablet Take 1 tablet by mouth daily.   norethindrone (MICRONOR) 0.35 MG tablet Take 1 tablet (0.35 mg total) by mouth daily.   [DISCONTINUED] traZODone (DESYREL) 50 MG tablet Take 0.5-1 tablets (25-50 mg total) by mouth at bedtime as needed for sleep. (Patient not taking: Reported on 06/01/2021)   No facility-administered medications prior to visit.    Review of Systems per HPI      Objective    BP (!) 153/74 (BP Location: Right Arm, Patient Position: Sitting, Cuff Size: Large)    Pulse 74    Temp 98.2 F (36.8 C) (Temporal)    Resp 16    Ht 5' 7.5" (1.715 m)    Wt 268 lb (121.6 kg)    LMP 05/21/2021 (Exact Date)    SpO2 99%  BMI 41.36 kg/m  BP Readings from Last 3 Encounters:  06/01/21 (!) 153/74  03/24/21 (!) 142/81  03/08/20 127/83   Wt Readings from Last 3 Encounters:  06/01/21 268 lb (121.6 kg)  03/24/21 275 lb 8 oz (125 kg)  03/08/20 259 lb (117.5 kg)      Physical Exam Vitals reviewed.  Constitutional:      General: She is not in acute distress.    Appearance: Normal appearance. She is well-developed. She is not diaphoretic.  HENT:     Head: Normocephalic and atraumatic.  Eyes:     General: No scleral icterus.    Conjunctiva/sclera: Conjunctivae normal.  Neck:     Thyroid: No thyromegaly.   Cardiovascular:     Rate and Rhythm: Normal rate and regular rhythm.     Pulses: Normal pulses.     Heart sounds: Normal heart sounds. No murmur heard. Pulmonary:     Effort: Pulmonary effort is normal. No respiratory distress.     Breath sounds: Normal breath sounds. No wheezing, rhonchi or rales.  Musculoskeletal:     Cervical back: Neck supple.     Right lower leg: No edema.     Left lower leg: No edema.  Lymphadenopathy:     Cervical: No cervical adenopathy.  Skin:    General: Skin is warm and dry.     Findings: No rash.  Neurological:     Mental Status: She is alert and oriented to person, place, and time. Mental status is at baseline.  Psychiatric:        Mood and Affect: Mood normal.        Behavior: Behavior normal.      No results found for any visits on 06/01/21.  Assessment & Plan     Problem List Items Addressed This Visit       Cardiovascular and Mediastinum   Essential hypertension    BP is elevated today She suspects this is related to stress Encourage taking home BPs Recheck at next visit and consider med titration        Other   Morbid obesity ([place])    Discussed importance of healthy weight management Discussed diet and exercise       GAD (generalized anxiety disorder) - Primary    Chronic and uncontrolled related to acute stressors Continue celexa 40 mg daily  Add wellbutrin XL 150mg  daily Encourage therapy and discussed synergism Repeat PHQ9 and GAD 7 at next visit      Relevant Medications   buPROPion (WELLBUTRIN XL) 150 MG 24 hr tablet     Return in about 6 weeks (around 07/13/2021) for GAD f/u, virtual ok, BP f/u.      I, [name], MD, have reviewed all documentation for this visit. The documentation on 06/01/21 for the exam, diagnosis, procedures, and orders are all accurate and complete.   Johntae Broxterman, Dionne Bucy, MD, MPH [facility]

## 2021-05-31 NOTE — Telephone Encounter (Signed)
° ° °  Chief Complaint: Tingling both arms this morning when she woke up- gone now. Symptoms: Nausea Frequency: Started last night. Pertinent Negatives: Patient denies numbness, chest pain Disposition: [] ED /[x] Urgent Care (no appt availability in office) / [] Appointment(In office/virtual)/ []  [facility]/ [] Home Care/ [] Refused Recommended Disposition /[] [facility]/ []  Follow-up with PCP Additional Notes: Pt. Concerned about her BP.  Reason for Disposition  [1] Numbness or tingling on both sides of body AND [2] is a new symptom present > 24 hours  Answer Assessment - Initial Assessment Questions 1. SYMPTOM: "What is the main symptom you are concerned about?" (e.g., weakness, numbness)     Tingling both arms 2. ONSET: "When did this start?" (minutes, hours, days; while sleeping)     Last night 3. LAST NORMAL: "When was the last time you (the patient) were normal (no symptoms)?"     Yesterday 4. PATTERN "Does this come and go, or has it been constant since it started?"  "Is it present now?"     Gone now 5. CARDIAC SYMPTOMS: "Have you had any of the following symptoms: chest pain, difficulty breathing, palpitations?"     No 6. NEUROLOGIC SYMPTOMS: "Have you had any of the following symptoms: headache, dizziness, vision loss, double vision, changes in speech, unsteady on your feet?"     No 7. OTHER SYMPTOMS: "Do you have any other symptoms?"     Nausea 8. PREGNANCY: "Is there any chance you are pregnant?" "When was your last menstrual period?"     No  Protocols used: Neurologic Deficit-A-AH

## 2021-06-01 ENCOUNTER — Encounter: Payer: Self-pay | Admitting: Family Medicine

## 2021-06-01 ENCOUNTER — Other Ambulatory Visit: Payer: Self-pay

## 2021-06-01 ENCOUNTER — Ambulatory Visit: Payer: BC Managed Care – PPO | Admitting: Family Medicine

## 2021-06-01 VITALS — BP 153/74 | HR 74 | Temp 98.2°F | Resp 16 | Ht 67.5 in | Wt 268.0 lb

## 2021-06-01 DIAGNOSIS — I10 Essential (primary) hypertension: Secondary | ICD-10-CM

## 2021-06-01 DIAGNOSIS — F411 Generalized anxiety disorder: Secondary | ICD-10-CM | POA: Diagnosis not present

## 2021-06-01 MED ORDER — BUPROPION HCL ER (XL) 150 MG PO TB24: 150.0000 mg | ORAL_TABLET | Freq: Every day | ORAL | 1 refills | Status: DC

## 2021-06-01 NOTE — Assessment & Plan Note (Signed)
Chronic and uncontrolled related to acute stressors Continue celexa 40 mg daily  Add wellbutrin XL 150mg  daily Encourage therapy and discussed synergism Repeat PHQ9 and GAD 7 at next visit

## 2021-06-01 NOTE — Assessment & Plan Note (Signed)
Discussed importance of healthy weight management Discussed diet and exercise  

## 2021-06-01 NOTE — Assessment & Plan Note (Signed)
BP is elevated today She suspects this is related to stress Encourage taking home BPs Recheck at next visit and consider med titration

## 2021-07-03 ENCOUNTER — Encounter: Payer: Self-pay | Admitting: Family Medicine

## 2021-07-03 ENCOUNTER — Other Ambulatory Visit: Payer: Self-pay

## 2021-07-03 ENCOUNTER — Telehealth (INDEPENDENT_AMBULATORY_CARE_PROVIDER_SITE_OTHER): Payer: BC Managed Care – PPO | Admitting: Family Medicine

## 2021-07-03 DIAGNOSIS — F411 Generalized anxiety disorder: Secondary | ICD-10-CM

## 2021-07-03 MED ORDER — BUPROPION HCL ER (XL) 150 MG PO TB24: 150.0000 mg | ORAL_TABLET | Freq: Every day | ORAL | 1 refills | Status: DC

## 2021-07-03 NOTE — Assessment & Plan Note (Signed)
Chronic and well controlled Continue celexa nad wellbutrin at current does Have discussed med and therapy synergy previously

## 2021-07-03 NOTE — Progress Notes (Signed)
MyChart Video Visit    Virtual Visit via Video Note   This visit type was conducted due to national recommendations for restrictions regarding the COVID-19 Pandemic (e.g. social distancing) in an effort to limit this patient's exposure and mitigate transmission in our community. This patient is at least at moderate risk for complications without adequate follow up. This format is felt to be most appropriate for this patient at this time. Physical exam was limited by quality of the video and audio technology used for the visit.    Patient location: home Provider location: [facility] Persons involved in the visit: patient, provider   I discussed the limitations of evaluation and management by telemedicine and the availability of in person appointments. The patient expressed understanding and agreed to proceed.  Patient: Elizabeth Stevens   DOB: 1980/09/20   41 y.o. Female  MRN: [id number] Visit Date: 07/03/2021  Today's healthcare provider: Shirlee Latch, MD   Chief Complaint  Patient presents with   Anxiety   Subjective    HPI  Anxiety, Follow-up  She was last seen for anxiety 1 months ago. Changes made at last visit include added Wellbutrin XL 150mg .   She reports excellent compliance with treatment. She reports excellent tolerance of treatment. She is not having side effects.   She feels her anxiety is mild and Improved since last visit.  Wellbutrin has helped tremendously.   Symptoms: No chest pain No difficulty concentrating  No dizziness No fatigue  No feelings of losing control No insomnia  No irritable No palpitations  No panic attacks No racing thoughts  No shortness of breath No sweating  No tremors/shakes    GAD-7 Results GAD-7 Generalized Anxiety Disorder Screening Tool 06/01/2021 08/21/2019 12/03/2017  1. Feeling Nervous, Anxious, or on Edge 1 0 3  2. Not Being Able to Stop or Control Worrying 1 0 1  3. Worrying Too Much About  Different Things 0 0 0  4. Trouble Relaxing 0 0 1  5. Being So Restless it's Hard To Sit Still 0 0 0  6. Becoming Easily Annoyed or Irritable 1 1 2   7. Feeling Afraid As If Something Awful Might Happen 0 0 0  Total GAD-7 Score 3 1 7   Difficulty At Work, Home, or Getting  Along With Others? Somewhat difficult Not difficult at all Somewhat difficult    PHQ-9 Scores PHQ9 SCORE ONLY 07/03/2021 06/01/2021 03/08/2020  PHQ-9 Total Score 1 4 2     ---------------------------------------------------------------------------------------------------    Medications: Outpatient Medications Prior to Visit  Medication Sig   amLODipine (NORVASC) 5 MG tablet TAKE 1 TABLET(5 MG) BY MOUTH DAILY   Ascorbic Acid (VITAMIN C) 1000 MG tablet Take 1,000 mg by mouth daily.   cholecalciferol (VITAMIN D3) 25 MCG (1000 UNIT) tablet Take 1,000 Units by mouth daily.   citalopram (CELEXA) 40 MG tablet Take 1 tablet (40 mg total) by mouth daily.   fluticasone (FLONASE) 50 MCG/ACT nasal spray Place 2 sprays into both nostrils daily.   Multiple Vitamin (MULTIVITAMIN) tablet Take 1 tablet by mouth daily.   norethindrone (MICRONOR) 0.35 MG tablet Take 1 tablet (0.35 mg total) by mouth daily.   [DISCONTINUED] buPROPion (WELLBUTRIN XL) 150 MG 24 hr tablet Take 1 tablet (150 mg total) by mouth daily.   No facility-administered medications prior to visit.    Review of Systems per HPI    Objective    There were no vitals taken for this visit.   Physical Exam Constitutional:  General: She is not in acute distress.    Appearance: Normal appearance.  HENT:     Head: Normocephalic.  Pulmonary:     Effort: Pulmonary effort is normal. No respiratory distress.  Neurological:     Mental Status: She is alert and oriented to person, place, and time. Mental status is at baseline.       Assessment & Plan     Problem List Items Addressed This Visit       Other   GAD (generalized anxiety disorder) - Primary     Chronic and well controlled Continue celexa nad wellbutrin at current does Have discussed med and therapy synergy previously      Relevant Medications   buPROPion (WELLBUTRIN XL) 150 MG 24 hr tablet     Return in about 2 months (around 08/31/2021) for CPE, as scheduled.     I discussed the assessment and treatment plan with the patient. The patient was provided an opportunity to ask questions and all were answered. The patient agreed with the plan and demonstrated an understanding of the instructions.   The patient was advised to call back or seek an in-person evaluation if the symptoms worsen or if the condition fails to improve as anticipated.    I,Bree Heinzelman,acting as a Neurosurgeon for Shirlee Latch, MD.,have documented all relevant documentation on the behalf of Shirlee Latch, MD,as directed by  Shirlee Latch, MD while in the presence of Shirlee Latch, MD.  I, Shirlee Latch, MD, have reviewed all documentation for this visit. The documentation on 07/03/21 for the exam, diagnosis, procedures, and orders are all accurate and complete.   Montay Vanvoorhis, Marzella Schlein, MD, MPH [facility]

## 2021-08-13 ENCOUNTER — Other Ambulatory Visit: Payer: Self-pay | Admitting: Family Medicine

## 2021-08-13 DIAGNOSIS — I10 Essential (primary) hypertension: Secondary | ICD-10-CM

## 2021-08-28 ENCOUNTER — Encounter: Payer: BC Managed Care – PPO | Admitting: Physician Assistant

## 2021-08-28 ENCOUNTER — Encounter: Payer: Self-pay | Admitting: Family Medicine

## 2021-08-28 ENCOUNTER — Ambulatory Visit (INDEPENDENT_AMBULATORY_CARE_PROVIDER_SITE_OTHER): Payer: BC Managed Care – PPO | Admitting: Family Medicine

## 2021-08-28 VITALS — BP 124/85 | HR 65 | Temp 98.9°F | Resp 16 | Ht 67.5 in | Wt 255.8 lb

## 2021-08-28 DIAGNOSIS — E669 Obesity, unspecified: Secondary | ICD-10-CM

## 2021-08-28 DIAGNOSIS — I10 Essential (primary) hypertension: Secondary | ICD-10-CM | POA: Diagnosis not present

## 2021-08-28 DIAGNOSIS — Z0000 Encounter for general adult medical examination without abnormal findings: Secondary | ICD-10-CM

## 2021-08-28 DIAGNOSIS — F411 Generalized anxiety disorder: Secondary | ICD-10-CM

## 2021-08-28 DIAGNOSIS — Z1231 Encounter for screening mammogram for malignant neoplasm of breast: Secondary | ICD-10-CM

## 2021-08-28 DIAGNOSIS — Z6839 Body mass index (BMI) 39.0-39.9, adult: Secondary | ICD-10-CM

## 2021-08-28 MED ORDER — CITALOPRAM HYDROBROMIDE 40 MG PO TABS: 40.0000 mg | ORAL_TABLET | Freq: Every day | ORAL | 3 refills | Status: DC

## 2021-08-28 MED ORDER — BUPROPION HCL ER (XL) 150 MG PO TB24: 150.0000 mg | ORAL_TABLET | Freq: Every day | ORAL | 1 refills | Status: DC

## 2021-08-28 NOTE — Assessment & Plan Note (Signed)
Chronic and well controlled ?Continue celexa and wellbutrin at current dose ?Consider therapy ?

## 2021-08-28 NOTE — Assessment & Plan Note (Signed)
Well controlled Continue current medications Recheck metabolic panel F/u in 6 months  

## 2021-08-28 NOTE — Assessment & Plan Note (Signed)
Discussed importance of healthy weight management ?Discussed diet and exercise  ?Congratulated on weight loss ?

## 2021-08-28 NOTE — Progress Notes (Signed)
? ? ?I,Sulibeya S Dimas,acting as a scribe for Elizabeth Paganini, MD.,have documented all relevant documentation on the behalf of Elizabeth Paganini, MD,as directed by  Elizabeth Paganini, MD while in the presence of Elizabeth Paganini, MD. ? ? ?Complete physical exam ? ? ?Patient: Elizabeth Stevens   DOB: [birth year]   41 y.o. Female  MRN: [id number] ?Visit Date: 08/28/2021 ? ?Today's healthcare provider: [name], MD  ? ?Chief Complaint  ?Patient presents with  ? Annual Exam  ? ?Subjective  ?  ?Elizabeth Stevens is a 41 y.o. female who presents today for a complete physical exam.  ?She reports consuming a  counting macros  diet. Gym/ health club routine includes cardio. She generally feels well. She reports sleeping well. She does not have additional problems to discuss today.  ?HPI  ? ? ?Past Medical History:  ?Diagnosis Date  ? BRCA negative   ? family hx BRCA2+; pt is BRCA neg; at population risk (13%)   ? FH: BRCA2 gene positive   ? Hypertension   ? Obesity (BMI 35.0-39.9 without comorbidity)   ? BMI 41 on 11/29/2017  ? Pregnancy induced hypertension   ? ?Past Surgical History:  ?Procedure Laterality Date  ? CESAREAN SECTION  10/16/07; 01/24/2010; 11/20/2011  ? ?Social History  ? ?Socioeconomic History  ? Marital status: Married  ?  Spouse name: Jenny Reichmann  ? Number of children: 3  ? Years of education: 86  ? Highest education level: Bachelor's degree (e.g., BA, AB, BS)  ?Occupational History  ? Occupation: Pharmacist, hospital  ?  Comment: [employer]  ?Tobacco Use  ? Smoking status: Former  ?  Types: Cigarettes  ?  Quit date: 05/21/2005  ?  Years since quitting: 16.2  ? Smokeless tobacco: Never  ? Tobacco comments:  ?  former social smoker  ?Vaping Use  ? Vaping Use: Never used  ?Substance and Sexual Activity  ? Alcohol use: Yes  ?  Comment: drinks wine about 2 times per month  ? Drug use: No  ? Sexual activity: Yes  ?  Partners: Male  ?  Birth control/protection: Pill  ?Other Topics Concern  ? Not on file  ?Social  History Narrative  ? Not on file  ? ?Social Determinants of Health  ? ?Financial Resource Strain: Not on file  ?Food Insecurity: Not on file  ?Transportation Needs: Not on file  ?Physical Activity: Not on file  ?Stress: Not on file  ?Social Connections: Not on file  ?Intimate Partner Violence: Not on file  ? ?Family Status  ?Relation Name Status  ? Mother  Deceased  ? Father  Alive  ? Sister  Alive  ? [place]  ? PGM  Deceased  ? PGF  Deceased  ? Cousin  Alive  ? Other mat great aunt Deceased  ? MGM  Alive  ? MGF  Deceased  ? Neg Hx  (Not Specified)  ? ?Family History  ?Problem Relation Age of Onset  ? Breast cancer Mother 15  ? Hypertension Father   ? Seizures Sister   ? Breast cancer Maternal Aunt 73  ?     +BRCA 2/ daughter also +BRCA2  ? Heart failure Maternal Aunt   ? Cervical cancer Paternal Grandmother 15  ? Heart failure Paternal Grandfather   ? BRCA 1/2 Cousin   ?     BRCA 2 maternal side.   ? Breast cancer Other   ? Parkinson's disease Maternal Grandfather   ? Colon cancer Neg  Hx   ? Ovarian cancer Neg Hx   ? Diabetes Neg Hx   ? ?No Known Allergies  ?Patient Care Team: ?Virginia Crews, MD as PCP - General (Family Medicine)  ? ?Medications: ?Outpatient Medications Prior to Visit  ?Medication Sig  ? amLODipine (NORVASC) 5 MG tablet TAKE 1 TABLET(5 MG) BY MOUTH DAILY  ? Ascorbic Acid (VITAMIN C) 1000 MG tablet Take 1,000 mg by mouth daily.  ? cholecalciferol (VITAMIN D3) 25 MCG (1000 UNIT) tablet Take 1,000 Units by mouth daily.  ? fluticasone (FLONASE) 50 MCG/ACT nasal spray Place 2 sprays into both nostrils daily.  ? Multiple Vitamin (MULTIVITAMIN) tablet Take 1 tablet by mouth daily.  ? norethindrone (MICRONOR) 0.35 MG tablet Take 1 tablet (0.35 mg total) by mouth daily.  ? [DISCONTINUED] buPROPion (WELLBUTRIN XL) 150 MG 24 hr tablet Take 1 tablet (150 mg total) by mouth daily.  ? [DISCONTINUED] citalopram (CELEXA) 40 MG tablet Take 1 tablet (40 mg total) by mouth daily.  ? ?No  facility-administered medications prior to visit.  ? ? ?Review of Systems  ?Skin:   ?     Possible keloid on ear  ?All other systems reviewed and are negative. ? ?Last CBC ?Lab Results  ?Component Value Date  ? WBC 8.2 03/08/2020  ? HGB 14.1 03/08/2020  ? HCT 40.4 03/08/2020  ? MCV 92 03/08/2020  ? MCH 32.2 03/08/2020  ? RDW 12.1 03/08/2020  ? PLT 323 03/08/2020  ? ?Last metabolic panel ?Lab Results  ?Component Value Date  ? GLUCOSE 86 03/08/2020  ? NA 139 03/08/2020  ? K 4.4 03/08/2020  ? CL 105 03/08/2020  ? CO2 23 03/08/2020  ? BUN 10 03/08/2020  ? CREATININE 0.79 03/08/2020  ? GFRNONAA 93 03/08/2020  ? CALCIUM 9.4 03/08/2020  ? PHOS 2.6 (L) 09/18/2018  ? PROT 6.5 03/08/2020  ? ALBUMIN 4.0 03/08/2020  ? LABGLOB 2.5 03/08/2020  ? AGRATIO 1.6 03/08/2020  ? BILITOT 0.4 03/08/2020  ? ALKPHOS 57 03/08/2020  ? AST 17 03/08/2020  ? ALT 13 03/08/2020  ? ?Last lipids ?Lab Results  ?Component Value Date  ? CHOL 206 (H) 03/08/2020  ? HDL 53 03/08/2020  ? LDLCALC 138 (H) 03/08/2020  ? TRIG 84 03/08/2020  ? CHOLHDL 3.9 03/08/2020  ? ?Last hemoglobin A1c ?Lab Results  ?Component Value Date  ? HGBA1C 5.4 08/15/2016  ? ?Last thyroid functions ?Lab Results  ?Component Value Date  ? TSH 2.180 03/08/2020  ? ?  ? Objective  ?  ?BP 124/85 (BP Location: Left Arm, Patient Position: Sitting, Cuff Size: Large)   Pulse 65   Temp 98.9 ?F (37.2 ?C) (Temporal)   Resp 16   Ht 5' 7.5" (1.715 m)   Wt 255 lb 12.8 oz (116 kg)   LMP 07/28/2021 (Approximate)   BMI 39.47 kg/m?  ?BP Readings from Last 3 Encounters:  ?08/28/21 124/85  ?06/01/21 (!) 153/74  ?03/24/21 (!) 142/81  ? ?Wt Readings from Last 3 Encounters:  ?08/28/21 255 lb 12.8 oz (116 kg)  ?06/01/21 268 lb (121.6 kg)  ?03/24/21 275 lb 8 oz (125 kg)  ? ?  ? ? ?Physical Exam ?Vitals reviewed.  ?Constitutional:   ?   General: She is not in acute distress. ?   Appearance: Normal appearance. She is well-developed. She is not diaphoretic.  ?HENT:  ?   Head: Normocephalic and atraumatic.   ?   Right Ear: Tympanic membrane, ear canal and external ear normal.  ?   Left Ear:  Tympanic membrane, ear canal and external ear normal.  ?   Nose: Nose normal.  ?   Mouth/Throat:  ?   Mouth: Mucous membranes are moist.  ?   Pharynx: Oropharynx is clear. No oropharyngeal exudate.  ?Eyes:  ?   General: No scleral icterus. ?   Conjunctiva/sclera: Conjunctivae normal.  ?   Pupils: Pupils are equal, round, and reactive to light.  ?Neck:  ?   Thyroid: No thyromegaly.  ?Cardiovascular:  ?   Rate and Rhythm: Normal rate and regular rhythm.  ?   Pulses: Normal pulses.  ?   Heart sounds: Normal heart sounds. No murmur heard. ?Pulmonary:  ?   Effort: Pulmonary effort is normal. No respiratory distress.  ?   Breath sounds: Normal breath sounds. No wheezing or rales.  ?Abdominal:  ?   General: There is no distension.  ?   Palpations: Abdomen is soft.  ?   Tenderness: There is no abdominal tenderness.  ?Musculoskeletal:  ?   Cervical back: Neck supple.  ?   Right lower leg: No edema.  ?   Left lower leg: No edema.  ?Lymphadenopathy:  ?   Cervical: No cervical adenopathy.  ?Skin: ?   General: Skin is warm and dry.  ?Neurological:  ?   Mental Status: She is alert and oriented to person, place, and time. Mental status is at baseline.  ?   Gait: Gait normal.  ?Psychiatric:     ?   Mood and Affect: Mood normal.     ?   Behavior: Behavior normal.     ?   Thought Content: Thought content normal.  ?  ? ? ?Last depression screening scores ? ?  08/28/2021  ? 10:50 AM 07/03/2021  ?  3:59 PM 06/01/2021  ?  1:47 PM  ?PHQ 2/9 Scores  ?PHQ - 2 Score 2 0 2  ?PHQ- 9 Score _0 ? ?Last fall risk screening ? ?  08/28/2021  ? 10:50 AM  ?Fall Risk   ?Falls in the past year? 0  ?Number falls in past yr: 0  ?Injury with Fall? 0  ?Risk for fall due to : No Fall Risks  ?Follow up Falls evaluation completed  ? ?Last Audit-C alcohol use screening ? ?  08/28/2021  ? 10:50 AM  ?Alcohol Use Disorder Test (AUDIT)  ?1. How often do you have a drink containing  alcohol? 2  ?2. How many drinks containing alcohol do you have on a typical day when you are drinking? 0  ?3. How often do you have six or more drinks on one occasion? 0  ?AUDIT-C Score 2  ? ?A score of 3 or more in women, an

## 2021-08-29 LAB — COMPREHENSIVE METABOLIC PANEL
ALT: 8 IU/L (ref 0–32)
AST: 15 IU/L (ref 0–40)
Albumin/Globulin Ratio: 1.6 (ref 1.2–2.2)
Albumin: 4.2 g/dL (ref 3.8–4.8)
Alkaline Phosphatase: 60 IU/L (ref 44–121)
BUN/Creatinine Ratio: 11 (ref 9–23)
BUN: 11 mg/dL (ref 6–24)
Bilirubin Total: 0.5 mg/dL (ref 0.0–1.2)
CO2: 24 mmol/L (ref 20–29)
Calcium: 9.8 mg/dL (ref 8.7–10.2)
Chloride: 103 mmol/L (ref 96–106)
Creatinine, Ser: 1 mg/dL (ref 0.57–1.00)
Globulin, Total: 2.6 g/dL (ref 1.5–4.5)
Glucose: 100 mg/dL — ABNORMAL HIGH (ref 70–99)
Potassium: 4.6 mmol/L (ref 3.5–5.2)
Sodium: 139 mmol/L (ref 134–144)
Total Protein: 6.8 g/dL (ref 6.0–8.5)
eGFR: 72 mL/min/{1.73_m2} (ref >59)

## 2021-08-29 LAB — LIPID PANEL WITH LDL/HDL RATIO
Cholesterol, Total: 210 mg/dL — ABNORMAL HIGH (ref 100–199)
HDL: 54 mg/dL (ref >39)
LDL Chol Calc (NIH): 141 mg/dL — ABNORMAL HIGH (ref 0–99)
LDL/HDL Ratio: 2.6 ratio (ref 0.0–3.2)
Triglycerides: 86 mg/dL (ref 0–149)
VLDL Cholesterol Cal: 15 mg/dL (ref 5–40)

## 2021-08-29 LAB — CBC
Hematocrit: 44.5 % (ref 34.0–46.6)
Hemoglobin: 14.7 g/dL (ref 11.1–15.9)
MCH: 30.8 pg (ref 26.6–33.0)
MCHC: 33 g/dL (ref 31.5–35.7)
MCV: 93 fL (ref 79–97)
Platelets: 310 10*3/uL (ref 150–450)
RBC: 4.77 x10E6/uL (ref 3.77–5.28)
RDW: 12.8 % (ref 11.7–15.4)
WBC: 8.6 10*3/uL (ref 3.4–10.8)

## 2021-11-16 ENCOUNTER — Encounter: Payer: Self-pay | Admitting: Family Medicine

## 2021-11-16 ENCOUNTER — Other Ambulatory Visit: Payer: Self-pay | Admitting: Family Medicine

## 2021-11-17 MED ORDER — NORETHINDRONE 0.35 MG PO TABS: ORAL_TABLET | ORAL | 3 refills | Status: DC

## 2021-11-17 NOTE — Telephone Encounter (Signed)
Requested Prescriptions  Pending Prescriptions Disp Refills  . norethindrone (MICRONOR) 0.35 MG tablet [Pharmacy Med Name: NORETHINDRONE 0.35MG  TABLETS 28S] 84 tablet 3    Sig: TAKE 1 TABLET(0.35 MG) BY MOUTH DAILY     OB/GYN: Contraceptives - Progestins Passed - 11/16/2021 10:20 PM      Passed - Last BP in normal range    BP Readings from Last 1 Encounters:  08/28/21 124/85         Passed - Valid encounter within last 12 months    Recent Outpatient Visits          2 months ago Encounter for annual physical exam   [facility] [place], Marzella Schlein, MD   4 months ago GAD (generalized anxiety disorder)   [facility], Marzella Schlein, MD   5 months ago GAD (generalized anxiety disorder)   [facility], Marzella Schlein, MD   7 months ago Acute cystitis without hematuria   [facility] Alfredia Ferguson, PA-C   9 months ago Left ear pain   [facility] Chrismon, Jodell Cipro, PA-C      Future Appointments            In 3 months Bacigalupo, Marzella Schlein, MD [facility]           Passed - Patient is not a smoker

## 2021-11-30 ENCOUNTER — Ambulatory Visit
Admission: RE | Admit: 2021-11-30 | Discharge: 2021-11-30 | Disposition: A | Discharge status: Home / Self Care | Payer: BC Managed Care – PPO | Source: Ambulatory Visit | Attending: Family Medicine | Admitting: Family Medicine

## 2021-11-30 DIAGNOSIS — Z1231 Encounter for screening mammogram for malignant neoplasm of breast: Secondary | ICD-10-CM | POA: Insufficient documentation

## 2021-12-01 NOTE — Progress Notes (Signed)
Hi Cooper  Normal mammogram; repeat in 1 year.  Please let us know if you have any questions.  Thank you,  Aundrey Elahi, FNP 

## 2021-12-03 ENCOUNTER — Encounter: Payer: Self-pay | Admitting: Family Medicine

## 2021-12-21 ENCOUNTER — Ambulatory Visit (INDEPENDENT_AMBULATORY_CARE_PROVIDER_SITE_OTHER): Payer: BC Managed Care – PPO | Admitting: Physician Assistant

## 2021-12-21 ENCOUNTER — Encounter: Payer: Self-pay | Admitting: Physician Assistant

## 2021-12-21 VITALS — BP 129/83 | HR 73 | Temp 98.2°F | Ht 67.5 in | Wt 266.0 lb

## 2021-12-21 DIAGNOSIS — N898 Other specified noninflammatory disorders of vagina: Secondary | ICD-10-CM | POA: Diagnosis not present

## 2021-12-21 DIAGNOSIS — R300 Dysuria: Secondary | ICD-10-CM | POA: Diagnosis not present

## 2021-12-21 LAB — POCT URINALYSIS DIPSTICK
Bilirubin, UA: NEGATIVE
Blood, UA: NEGATIVE
Glucose, UA: NEGATIVE
Ketones, UA: NEGATIVE
Leukocytes, UA: NEGATIVE
Nitrite, UA: NEGATIVE
Protein, UA: NEGATIVE
Spec Grav, UA: 1.015 (ref 1.010–1.025)
Urobilinogen, UA: 0.2 E.U./dL
pH, UA: 6 (ref 5.0–8.0)

## 2021-12-21 NOTE — Progress Notes (Signed)
I,Sha'taria Tyson,acting as a Neurosurgeon for [employer], PA-C.,have documented all relevant documentation on the behalf of Alfredia Ferguson, PA-C,as directed by  Alfredia Ferguson, PA-C while in the presence of Alfredia Ferguson, PA-C.   Established patient visit   Patient: Elizabeth Stevens   DOB: 1980-10-19   41 y.o. Female  MRN: [id number] Visit Date: 12/21/2021  Today's healthcare provider: Alfredia Ferguson, PA-C   Cc. Vaginal discharge  Subjective    HPI   Elizabeth Stevens is a 41 y/o female who presents today vaginal discharge she describes as clear and watery, itchiness, dysuria for the last 3 to 4 days.  This happened last month after her cycle she took Monistat for 7 days and the symptoms cleared.  She feels the symptoms are back.  Denies any abdominal pain fevers chills lower back pain.  Denies any STI exposure, sexually active with her husband. Denies rash, lesion.  Medications: Outpatient Medications Prior to Visit  Medication Sig   amLODipine (NORVASC) 5 MG tablet TAKE 1 TABLET(5 MG) BY MOUTH DAILY   Ascorbic Acid (VITAMIN C) 1000 MG tablet Take 1,000 mg by mouth daily.   buPROPion (WELLBUTRIN XL) 150 MG 24 hr tablet Take 1 tablet (150 mg total) by mouth daily.   cholecalciferol (VITAMIN D3) 25 MCG (1000 UNIT) tablet Take 1,000 Units by mouth daily.   citalopram (CELEXA) 40 MG tablet Take 1 tablet (40 mg total) by mouth daily.   fluticasone (FLONASE) 50 MCG/ACT nasal spray Place 2 sprays into both nostrils daily.   Multiple Vitamin (MULTIVITAMIN) tablet Take 1 tablet by mouth daily.   norethindrone (MICRONOR) 0.35 MG tablet TAKE 1 TABLET(0.35 MG) BY MOUTH DAILY   No facility-administered medications prior to visit.    Review of Systems  Constitutional:  Negative for fatigue and fever.  Respiratory:  Negative for cough and shortness of breath.   Cardiovascular:  Negative for chest pain and leg swelling.  Gastrointestinal:  Negative for abdominal pain.  Genitourinary:  Positive  for dysuria and vaginal discharge.  Neurological:  Negative for dizziness and headaches.      Objective    LMP 11/20/2021  Blood pressure 129/83, pulse 73, temperature 98.2 F (36.8 C), temperature source Oral, height 5' 7.5" (1.715 m), weight 266 lb (120.7 kg), last menstrual period 12/18/2021, SpO2 99 %.   Physical Exam Vitals reviewed.  Constitutional:      Appearance: She is not ill-appearing.  HENT:     Head: Normocephalic.  Eyes:     Conjunctiva/sclera: Conjunctivae normal.  Cardiovascular:     Rate and Rhythm: Normal rate.  Pulmonary:     Effort: Pulmonary effort is normal. No respiratory distress.  Neurological:     General: No focal deficit present.     Mental Status: She is alert and oriented to person, place, and time.  Psychiatric:        Mood and Affect: Mood normal.        Behavior: Behavior normal.      No results found for any visits on 12/21/21.  Assessment & Plan     Dysuria 2. Vaginal discharge -Pt offered self swab vs exam fine with self swab Nuswab, ua - leuk, blood, sending for culture. Advised potential tx for yeast infection, will wait on results  I, Alfredia Ferguson, PA-C have reviewed all documentation for this visit. The documentation on  12/21/2021 for the exam, diagnosis, procedures, and orders are all accurate and complete.  Alfredia Ferguson, PA-C [facility] [address] [place], [place], [address] Office: [phone] Fax: [place]

## 2021-12-22 ENCOUNTER — Other Ambulatory Visit: Payer: Self-pay | Admitting: Physician Assistant

## 2021-12-22 ENCOUNTER — Encounter: Payer: Self-pay | Admitting: Physician Assistant

## 2021-12-22 DIAGNOSIS — B3731 Acute candidiasis of vulva and vagina: Secondary | ICD-10-CM

## 2021-12-22 MED ORDER — FLUCONAZOLE 150 MG PO TABS: 150.0000 mg | ORAL_TABLET | Freq: Once | ORAL | 0 refills | Status: AC

## 2021-12-26 LAB — NUSWAB VAGINITIS PLUS (VG+)
Candida albicans, NAA: NEGATIVE
Candida glabrata, NAA: NEGATIVE
Chlamydia trachomatis, NAA: NEGATIVE
Neisseria gonorrhoeae, NAA: NEGATIVE
Trich vag by NAA: NEGATIVE

## 2021-12-26 LAB — URINE CULTURE

## 2021-12-27 ENCOUNTER — Encounter: Payer: Self-pay | Admitting: Physician Assistant

## 2021-12-27 ENCOUNTER — Telehealth: Payer: BC Managed Care – PPO | Admitting: Physician Assistant

## 2021-12-27 DIAGNOSIS — L237 Allergic contact dermatitis due to plants, except food: Secondary | ICD-10-CM | POA: Diagnosis not present

## 2021-12-28 MED ORDER — PREDNISONE 10 MG PO TABS: ORAL_TABLET | ORAL | 0 refills | Status: AC

## 2021-12-28 NOTE — Progress Notes (Signed)

## 2021-12-28 NOTE — Progress Notes (Signed)
I have spent 5 minutes in review of e-visit questionnaire, review and updating patient chart, medical decision making and response to patient.   Carter Kaman Cody Guillermina Shaft, PA-C    

## 2022-03-02 ENCOUNTER — Ambulatory Visit (INDEPENDENT_AMBULATORY_CARE_PROVIDER_SITE_OTHER): Payer: BC Managed Care – PPO | Admitting: Family Medicine

## 2022-03-02 ENCOUNTER — Encounter: Payer: Self-pay | Admitting: Family Medicine

## 2022-03-02 VITALS — BP 130/68 | HR 69 | Temp 98.6°F | Resp 16 | Wt 264.0 lb

## 2022-03-02 DIAGNOSIS — J069 Acute upper respiratory infection, unspecified: Secondary | ICD-10-CM

## 2022-03-02 DIAGNOSIS — I10 Essential (primary) hypertension: Secondary | ICD-10-CM | POA: Diagnosis not present

## 2022-03-02 DIAGNOSIS — G4700 Insomnia, unspecified: Secondary | ICD-10-CM

## 2022-03-02 DIAGNOSIS — E669 Obesity, unspecified: Secondary | ICD-10-CM

## 2022-03-02 DIAGNOSIS — F411 Generalized anxiety disorder: Secondary | ICD-10-CM

## 2022-03-02 DIAGNOSIS — E782 Mixed hyperlipidemia: Secondary | ICD-10-CM | POA: Insufficient documentation

## 2022-03-02 NOTE — Assessment & Plan Note (Signed)
Discussed importance of healthy weight management Discussed diet and exercise  

## 2022-03-02 NOTE — Assessment & Plan Note (Signed)
Chronic and well controlled Continue celexa and wellbutrin at current dose

## 2022-03-02 NOTE — Assessment & Plan Note (Signed)
Well controlled - no complaints today

## 2022-03-02 NOTE — Progress Notes (Signed)
I,[name],acting as a scribe for Elizabeth Paganini, MD.,have documented all relevant documentation on the behalf of Elizabeth Paganini, MD,as directed by  Elizabeth Paganini, MD while in the presence of Elizabeth Paganini, MD.   Established patient visit   Patient: Elizabeth Stevens   DOB: August 28, 1980   41 y.o. Female  MRN: [id number] Visit Date: 03/02/2022  Today's healthcare provider: [name], MD   Chief Complaint  Patient presents with   Follow-up   Hypertension   Anxiety   Sinusitis   Subjective    HPI  Hypertension, follow-up  BP Readings from Last 3 Encounters:  03/02/22 130/68  12/21/21 129/83  08/28/21 124/85   Wt Readings from Last 3 Encounters:  03/02/22 264 lb (119.7 kg)  12/21/21 266 lb (120.7 kg)  08/28/21 255 lb 12.8 oz (116 kg)     She was last seen for hypertension 6 months ago.  Management since that visit includes; Well controlled. Continue current medications.  Outside blood pressures are not checking.  Pertinent labs Lab Results  Component Value Date   CHOL 210 (H) 08/28/2021   HDL 54 08/28/2021   LDLCALC 141 (H) 08/28/2021   TRIG 86 08/28/2021   CHOLHDL 3.9 03/08/2020   Lab Results  Component Value Date   NA 139 08/28/2021   K 4.6 08/28/2021   CREATININE 1.00 08/28/2021   EGFR 72 08/28/2021   GLUCOSE 100 (H) 08/28/2021   TSH 2.180 03/08/2020     The 10-year ASCVD risk score (Arnett DK, et al., 2019) is: 1.1%  --------------------------------------------------------------------------------------------------- Anxiety, Follow-up  She was last seen for anxiety 6 months ago. Changes made at last visit include Chronic and well controlled. Continue celexa and wellbutrin at current dose. Consider therapy.  GAD-7 Results    06/01/2021    1:45 PM 08/21/2019   11:25 AM 12/03/2017   11:02 AM  GAD-7 Generalized Anxiety Disorder Screening Tool  1. Feeling Nervous, Anxious, or on Edge 1 0 3  2. Not Being Able to Stop or  Control Worrying 1 0 1  3. Worrying Too Much About Different Things 0 0 0  4. Trouble Relaxing 0 0 1  5. Being So Restless it's Hard To Sit Still 0 0 0  6. Becoming Easily Annoyed or Irritable _0 7. Feeling Afraid As If Something Awful Might Happen 0 0 0  Total GAD-7 Score _1 Difficulty At Work, Home, or Getting  Along With Others? Somewhat difficult Not difficult at all Somewhat difficult    PHQ-9 Scores    08/28/2021   10:50 AM 07/03/2021    3:59 PM 06/01/2021    1:47 PM  PHQ9 SCORE ONLY  PHQ-9 Total Score _2 ---------------------------------------------------------------------------------------------------  Patient also has sinus pressure and nasal congestion x3 days. Patient also has symptoms of headache and ear congestion. No fever, no sore throat, no cough. Patient states she took a covid test, which was negative. Patient has been treating symptoms with allergy medication with slight relief. Taking flonase BID. No other meds.   Medications: Outpatient Medications Prior to Visit  Medication Sig   amLODipine (NORVASC) 5 MG tablet TAKE 1 TABLET(5 MG) BY MOUTH DAILY   Ascorbic Acid (VITAMIN C) 1000 MG tablet Take 1,000 mg by mouth daily.   buPROPion (WELLBUTRIN XL) 150 MG 24 hr tablet Take 1 tablet (150 mg total) by mouth daily.   Chlorpheniramine Maleate (ALLERGY PO) Take by mouth.   cholecalciferol (VITAMIN D3)  25 MCG (1000 UNIT) tablet Take 1,000 Units by mouth daily.   citalopram (CELEXA) 40 MG tablet Take 1 tablet (40 mg total) by mouth daily.   fluticasone (FLONASE) 50 MCG/ACT nasal spray Place 2 sprays into both nostrils daily.   Multiple Vitamin (MULTIVITAMIN) tablet Take 1 tablet by mouth daily.   norethindrone (MICRONOR) 0.35 MG tablet TAKE 1 TABLET(0.35 MG) BY MOUTH DAILY   No facility-administered medications prior to visit.    Review of Systems  Constitutional:  Negative for appetite change, chills, fatigue and fever.  Respiratory:  Negative for  chest tightness and shortness of breath.   Cardiovascular:  Negative for chest pain and palpitations.  Gastrointestinal:  Negative for abdominal pain, nausea and vomiting.  Neurological:  Negative for dizziness and weakness.       Objective    BP 130/68 (BP Location: Left Arm, Patient Position: Sitting, Cuff Size: Large)   Pulse 69   Temp 98.6 F (37 C) (Oral)   Resp 16   Wt 264 lb (119.7 kg)   SpO2 98%   BMI 40.74 kg/m    Physical Exam Vitals reviewed.  Constitutional:      General: She is not in acute distress.    Appearance: Normal appearance. She is well-developed. She is not diaphoretic.  HENT:     Head: Normocephalic and atraumatic.     Right Ear: Tympanic membrane, ear canal and external ear normal.     Left Ear: Tympanic membrane, ear canal and external ear normal.     Nose: Congestion present.     Mouth/Throat:     Mouth: Mucous membranes are moist.     Pharynx: No oropharyngeal exudate.  Eyes:     General: No scleral icterus.    Conjunctiva/sclera: Conjunctivae normal.  Neck:     Thyroid: No thyromegaly.  Cardiovascular:     Rate and Rhythm: Normal rate and regular rhythm.     Pulses: Normal pulses.     Heart sounds: Normal heart sounds. No murmur heard. Pulmonary:     Effort: Pulmonary effort is normal. No respiratory distress.     Breath sounds: Normal breath sounds. No wheezing, rhonchi or rales.  Musculoskeletal:     Cervical back: Neck supple.     Right lower leg: No edema.     Left lower leg: No edema.  Lymphadenopathy:     Cervical: No cervical adenopathy.  Skin:    General: Skin is warm and dry.     Findings: No rash.  Neurological:     Mental Status: She is alert and oriented to person, place, and time. Mental status is at baseline.  Psychiatric:        Mood and Affect: Mood normal.        Behavior: Behavior normal.       No results found for any visits on 03/02/22.  Assessment & Plan     Problem List Items Addressed This Visit        Cardiovascular and Mediastinum   Essential hypertension - Primary    Well controlled Continue current medications Reviewed metabolic panel F/u in 6 months         Other   Obesity    Discussed importance of healthy weight management Discussed diet and exercise       GAD (generalized anxiety disorder)    Chronic and well controlled Continue celexa and wellbutrin at current dose      Insomnia    Well controlled - no complaints today  Moderate mixed hyperlipidemia not requiring statin therapy    Reviewed last lipid panel Not currently on a statin Recheck FLP and CMP at next visit Discussed diet and exercise       Other Visit Diagnoses     Viral URI           - symptoms and exam c/w viral URI - no evidence of strep pharyngitis, CAP, AOM, bacterial sinusitis, or other bacterial infection - if sinus symptoms persist past 7 days (10/18), will Rx antibiotic as she has h/o recurrent sinusitis - discussed symptomatic management, natural course, and return precautions   Return in about 6 months (around 09/01/2022) for CPE.      I, Elizabeth Paganini, MD, have reviewed all documentation for this visit. The documentation on 03/02/22 for the exam, diagnosis, procedures, and orders are all accurate and complete.   Skiler Tye, Dionne Bucy, MD, MPH [facility]

## 2022-03-02 NOTE — Assessment & Plan Note (Signed)
Reviewed last lipid panel °Not currently on a statin °Recheck FLP and CMP at next visit °Discussed diet and exercise  °

## 2022-03-02 NOTE — Assessment & Plan Note (Signed)
Well controlled Continue current medications Reviewed metabolic panel F/u in 6 months  

## 2022-06-12 ENCOUNTER — Encounter: Payer: Self-pay | Admitting: Family Medicine

## 2022-06-12 MED ORDER — FLUTICASONE PROPIONATE 50 MCG/ACT NA SUSP: 2.0000 | Freq: Every day | NASAL | 6 refills | Status: DC

## 2022-08-07 ENCOUNTER — Other Ambulatory Visit: Payer: Self-pay

## 2022-08-07 MED ORDER — BUPROPION HCL ER (XL) 150 MG PO TB24: 150.0000 mg | ORAL_TABLET | Freq: Every day | ORAL | 1 refills | Status: DC

## 2022-09-06 NOTE — Progress Notes (Deleted)
Complete physical exam   Patient: Elizabeth Stevens   DOB: 06-27-1980   42 y.o. Female  MRN: [id number] Visit Date: 09/07/2022  Today's healthcare provider: Shirlee Latch, MD   No chief complaint on file.  Subjective    Elizabeth Stevens is a 42 y.o. female who presents today for a complete physical exam.  She reports consuming a {diet types:17450} diet. {Exercise:19826} She generally feels {well/fairly well/poorly:18703}. She reports sleeping {well/fairly well/poorly:18703}. She {does/does not:200015} have additional problems to discuss today.  HPI  ***  Past Medical History:  Diagnosis Date   BRCA negative    family hx BRCA2+; pt is BRCA neg; at population risk (13%)    FH: BRCA2 gene positive    Hypertension    Obesity (BMI 35.0-39.9 without comorbidity)    BMI 41 on 11/29/2017   Pregnancy induced hypertension    Past Surgical History:  Procedure Laterality Date   CESAREAN SECTION  10/16/07; 01/24/2010; 11/20/2011   Social History   Socioeconomic History   Marital status: Married    Spouse name: John   Number of children: 3   Years of education: 16   Highest education level: Bachelor's degree (e.g., BA, AB, BS)  Occupational History   Occupation: Runner, broadcasting/film/video    Comment: [employer]  Tobacco Use   Smoking status: Former    Types: Cigarettes    Quit date: 05/21/2005    Years since quitting: 17.3   Smokeless tobacco: Never   Tobacco comments:    former social smoker  Advertising account planner   Vaping Use: Never used  Substance and Sexual Activity   Alcohol use: Yes    Comment: drinks wine about 2 times per month   Drug use: No   Sexual activity: Yes    Partners: Male    Birth control/protection: Pill  Other Topics Concern   Not on file  Social History Narrative   Not on file   Social Determinants of Health   Financial Resource Strain: Not on file  Food Insecurity: Not on file  Transportation Needs: Not on file  Physical Activity: Not on file   Stress: Not on file  Social Connections: Not on file  Intimate Partner Violence: Not on file   Family Status  Relation Name Status   Mother  Deceased   Father  Alive   Sister  Alive   Mat Aunt  Alive   PGM  Deceased   PGF  Deceased   Cousin  Alive   Other mat great aunt Deceased   MGM  Alive   MGF  Deceased   Neg Hx  (Not Specified)   Family History  Problem Relation Age of Onset   Breast cancer Mother 26   Hypertension Father    Seizures Sister    Breast cancer Maternal Aunt 69       +BRCA 2/ daughter also +BRCA2   Heart failure Maternal Aunt    Cervical cancer Paternal Grandmother 91   Heart failure Paternal Grandfather    BRCA 1/2 Cousin        BRCA 2 maternal side.    Breast cancer Other    Parkinson's disease Maternal Grandfather    Colon cancer Neg Hx    Ovarian cancer Neg Hx    Diabetes Neg Hx    No Known Allergies  Patient Care Team: Erasmo Downer, MD as PCP - General (Family Medicine)   Medications: Outpatient Medications Prior to Visit  Medication Sig  amLODipine (NORVASC) 5 MG tablet TAKE 1 TABLET(5 MG) BY MOUTH DAILY   Ascorbic Acid (VITAMIN C) 1000 MG tablet Take 1,000 mg by mouth daily.   buPROPion (WELLBUTRIN XL) 150 MG 24 hr tablet Take 1 tablet (150 mg total) by mouth daily.   Chlorpheniramine Maleate (ALLERGY PO) Take by mouth.   cholecalciferol (VITAMIN D3) 25 MCG (1000 UNIT) tablet Take 1,000 Units by mouth daily.   citalopram (CELEXA) 40 MG tablet Take 1 tablet (40 mg total) by mouth daily.   fluticasone (FLONASE) 50 MCG/ACT nasal spray Place 2 sprays into both nostrils daily.   Multiple Vitamin (MULTIVITAMIN) tablet Take 1 tablet by mouth daily.   norethindrone (MICRONOR) 0.35 MG tablet TAKE 1 TABLET(0.35 MG) BY MOUTH DAILY   No facility-administered medications prior to visit.    Review of Systems  {Labs  Heme  Chem  Endocrine  Serology  Results Review (optional):23779}  Objective    There were no vitals taken for this  visit. {Show previous vital signs (optional):23777}   Physical Exam  ***  Last depression screening scores    08/28/2021   10:50 AM 07/03/2021    3:59 PM 06/01/2021    1:47 PM  PHQ 2/9 Scores  PHQ - 2 Score 2 0 2  PHQ- 9 Score Last fall risk screening    08/28/2021   10:50 AM  Fall Risk   Falls in the past year? 0  Number falls in past yr: 0  Injury with Fall? 0  Risk for fall due to : No Fall Risks  Follow up Falls evaluation completed   Last Audit-C alcohol use screening    08/28/2021   10:50 AM  Alcohol Use Disorder Test (AUDIT)  1. How often do you have a drink containing alcohol? 2  2. How many drinks containing alcohol do you have on a typical day when you are drinking? 0  3. How often do you have six or more drinks on one occasion? 0  AUDIT-C Score 2   A score of 3 or more in women, and 4 or more in men indicates increased risk for alcohol abuse, EXCEPT if all of the points are from question 1   No results found for any visits on 09/07/22.  Assessment & Plan    Routine Health Maintenance and Physical Exam  Exercise Activities and Dietary recommendations  Goals   None     Immunization History  Administered Date(s) Administered   Influenza,inj,Quad PF,6+ Mos 02/20/2019, 03/08/2020   Moderna Sars-Covid-2 Vaccination 07/18/2019, 08/15/2019   Tdap 02/20/2019    Health Maintenance  Topic Date Due   COVID-19 Vaccine (3 - 2023-24 season) 01/19/2022   INFLUENZA VACCINE  12/20/2022   MAMMOGRAM  12/01/2023   PAP SMEAR-Modifier  12/11/2023   DTaP/Tdap/Td (2 - Td or Tdap) 02/19/2029   Hepatitis C Screening  Completed   HIV Screening  Completed   HPV VACCINES  Aged Out    Discussed health benefits of physical activity, and encouraged her to engage in regular exercise appropriate for her age and condition.  ***  No follow-ups on file.     {provider attestation***:1}   Shirlee Latch, MD  [facility] [phone] (phone) [phone] (fax)  [facility]

## 2022-09-07 ENCOUNTER — Encounter: Payer: BC Managed Care – PPO | Admitting: Family Medicine

## 2022-09-17 ENCOUNTER — Ambulatory Visit (INDEPENDENT_AMBULATORY_CARE_PROVIDER_SITE_OTHER): Payer: BC Managed Care – PPO | Admitting: Family Medicine

## 2022-09-17 VITALS — BP 125/85 | HR 64 | Temp 98.0°F | Resp 12 | Wt 253.1 lb

## 2022-09-17 DIAGNOSIS — R739 Hyperglycemia, unspecified: Secondary | ICD-10-CM | POA: Diagnosis not present

## 2022-09-17 DIAGNOSIS — Z6839 Body mass index (BMI) 39.0-39.9, adult: Secondary | ICD-10-CM

## 2022-09-17 DIAGNOSIS — F411 Generalized anxiety disorder: Secondary | ICD-10-CM

## 2022-09-17 DIAGNOSIS — E782 Mixed hyperlipidemia: Secondary | ICD-10-CM | POA: Diagnosis not present

## 2022-09-17 DIAGNOSIS — I10 Essential (primary) hypertension: Secondary | ICD-10-CM

## 2022-09-17 DIAGNOSIS — E669 Obesity, unspecified: Secondary | ICD-10-CM | POA: Diagnosis not present

## 2022-09-17 MED ORDER — CITALOPRAM HYDROBROMIDE 40 MG PO TABS: 40.0000 mg | ORAL_TABLET | Freq: Every day | ORAL | 3 refills | Status: DC

## 2022-09-17 MED ORDER — AMLODIPINE BESYLATE 5 MG PO TABS: 5.0000 mg | ORAL_TABLET | Freq: Every day | ORAL | 3 refills | Status: DC

## 2022-09-17 MED ORDER — BUPROPION HCL ER (XL) 150 MG PO TB24: 150.0000 mg | ORAL_TABLET | Freq: Every day | ORAL | 3 refills | Status: DC

## 2022-09-17 NOTE — Assessment & Plan Note (Signed)
Discussed importance of healthy weight management Discussed diet and exercise  

## 2022-09-17 NOTE — Assessment & Plan Note (Signed)
Chronic and well controlled Continue celexa and wellbutrin at current dose 

## 2022-09-17 NOTE — Assessment & Plan Note (Signed)
Well controlled Continue current medications Recheck metabolic panel F/u in 6 months  

## 2022-09-17 NOTE — Progress Notes (Signed)
I,Sulibeya S Dimas,acting as a Neurosurgeon for Shirlee Latch, MD.,have documented all relevant documentation on the behalf of Shirlee Latch, MD,as directed by  Shirlee Latch, MD while in the presence of Shirlee Latch, MD.     Established patient visit   Patient: Elizabeth Stevens   DOB: [birth year]   42 y.o. Female  MRN: [id number] Visit Date: 09/17/2022  Today's healthcare provider: Shirlee Latch, MD   Chief Complaint  Patient presents with   Hypertension   Subjective    HPI   Hypertension, follow-up  BP Readings from Last 3 Encounters:  09/17/22 125/85  03/02/22 130/68  12/21/21 129/83   Wt Readings from Last 3 Encounters:  09/17/22 253 lb 1.6 oz (114.8 kg)  03/02/22 264 lb (119.7 kg)  12/21/21 266 lb (120.7 kg)     She was last seen for hypertension 6 months ago.  BP at that visit was 130/8. Management since that visit includes no changes.  She reports excellent compliance with treatment. She is not having side effects.   Outside blood pressures are not being checked.  Pertinent labs Lab Results  Component Value Date   CHOL 210 (H) 08/28/2021   HDL 54 08/28/2021   LDLCALC 141 (H) 08/28/2021   TRIG 86 08/28/2021   CHOLHDL 3.9 03/08/2020   Lab Results  Component Value Date   NA 139 08/28/2021   K 4.6 08/28/2021   CREATININE 1.00 08/28/2021   EGFR 72 08/28/2021   GLUCOSE 100 (H) 08/28/2021   TSH 2.180 03/08/2020     The 10-year ASCVD risk score (Arnett DK, et al., 2019) is: 1%  ---------------------------------------------------------------------------------------------------   Medications: Outpatient Medications Prior to Visit  Medication Sig   Ascorbic Acid (VITAMIN C) 1000 MG tablet Take 1,000 mg by mouth daily.   Chlorpheniramine Maleate (ALLERGY PO) Take by mouth.   cholecalciferol (VITAMIN D3) 25 MCG (1000 UNIT) tablet Take 1,000 Units by mouth daily.   fluticasone (FLONASE) 50 MCG/ACT nasal spray Place 2 sprays into both  nostrils daily.   Multiple Vitamin (MULTIVITAMIN) tablet Take 1 tablet by mouth daily.   norethindrone (MICRONOR) 0.35 MG tablet TAKE 1 TABLET(0.35 MG) BY MOUTH DAILY   [DISCONTINUED] amLODipine (NORVASC) 5 MG tablet TAKE 1 TABLET(5 MG) BY MOUTH DAILY   [DISCONTINUED] buPROPion (WELLBUTRIN XL) 150 MG 24 hr tablet Take 1 tablet (150 mg total) by mouth daily.   [DISCONTINUED] citalopram (CELEXA) 40 MG tablet Take 1 tablet (40 mg total) by mouth daily.   No facility-administered medications prior to visit.    Review of Systems  Constitutional:  Negative for appetite change and fatigue.  Eyes:  Negative for visual disturbance.  Respiratory:  Negative for chest tightness and shortness of breath.   Cardiovascular:  Negative for chest pain and leg swelling.  Gastrointestinal:  Negative for abdominal pain, nausea and vomiting.  Neurological:  Negative for dizziness, light-headedness and headaches.       Objective    BP 125/85 (BP Location: Left Arm, Patient Position: Sitting, Cuff Size: Large)   Pulse 64   Temp 98 F (36.7 C) (Temporal)   Resp 12   Wt 253 lb 1.6 oz (114.8 kg)   BMI 39.06 kg/m  BP Readings from Last 3 Encounters:  09/17/22 125/85  03/02/22 130/68  12/21/21 129/83   Wt Readings from Last 3 Encounters:  09/17/22 253 lb 1.6 oz (114.8 kg)  03/02/22 264 lb (119.7 kg)  12/21/21 266 lb (120.7 kg)      Physical Exam Vitals reviewed.  Constitutional:      General: She is not in acute distress.    Appearance: Normal appearance. She is well-developed. She is not diaphoretic.  HENT:     Head: Normocephalic and atraumatic.  Eyes:     General: No scleral icterus.    Conjunctiva/sclera: Conjunctivae normal.  Neck:     Thyroid: No thyromegaly.  Cardiovascular:     Rate and Rhythm: Normal rate and regular rhythm.     Pulses: Normal pulses.     Heart sounds: Normal heart sounds. No murmur heard. Pulmonary:     Effort: Pulmonary effort is normal. No respiratory  distress.     Breath sounds: Normal breath sounds. No wheezing, rhonchi or rales.  Musculoskeletal:     Cervical back: Neck supple.     Right lower leg: No edema.     Left lower leg: No edema.  Lymphadenopathy:     Cervical: No cervical adenopathy.  Skin:    General: Skin is warm and dry.     Findings: No rash.  Neurological:     Mental Status: She is alert and oriented to person, place, and time. Mental status is at baseline.  Psychiatric:        Mood and Affect: Mood normal.        Behavior: Behavior normal.       No results found for any visits on 09/17/22.  Assessment & Plan     Problem List Items Addressed This Visit       Cardiovascular and Mediastinum   Essential hypertension - Primary    Well controlled Continue current medications Recheck metabolic panel F/u in 6 months       Relevant Medications   amLODipine (NORVASC) 5 MG tablet   Other Relevant Orders   Comprehensive metabolic panel     Other   Obesity    Discussed importance of healthy weight management Discussed diet and exercise       GAD (generalized anxiety disorder)    Chronic and well controlled Continue celexa and wellbutrin at current dose      Relevant Medications   buPROPion (WELLBUTRIN XL) 150 MG 24 hr tablet   citalopram (CELEXA) 40 MG tablet   Moderate mixed hyperlipidemia not requiring statin therapy    Reviewed last lipid panel Not currently on a statin Recheck FLP and CMP Discussed diet and exercise       Relevant Medications   amLODipine (NORVASC) 5 MG tablet   Other Relevant Orders   Comprehensive metabolic panel   Lipid panel   Other Visit Diagnoses     Hyperglycemia       Relevant Orders   Hemoglobin A1c        Return in about 6 months (around 03/19/2023) for CPE.      I, Shirlee Latch, MD, have reviewed all documentation for this visit. The documentation on 09/17/22 for the exam, diagnosis, procedures, and orders are all accurate and  complete.   Melvie Paglia, Marzella Schlein, MD, MPH [facility]

## 2022-09-17 NOTE — Assessment & Plan Note (Signed)
Reviewed last lipid panel Not currently on a statin Recheck FLP and CMP Discussed diet and exercise  

## 2022-11-17 ENCOUNTER — Other Ambulatory Visit: Payer: Self-pay | Admitting: Family Medicine

## 2022-11-23 ENCOUNTER — Encounter: Payer: Self-pay | Admitting: Family Medicine

## 2022-11-23 MED ORDER — NORETHINDRONE 0.35 MG PO TABS: 1.0000 | ORAL_TABLET | Freq: Every day | ORAL | 3 refills | Status: DC

## 2022-12-04 ENCOUNTER — Other Ambulatory Visit: Payer: Self-pay | Admitting: Family Medicine

## 2022-12-04 DIAGNOSIS — Z1231 Encounter for screening mammogram for malignant neoplasm of breast: Secondary | ICD-10-CM

## 2022-12-13 ENCOUNTER — Ambulatory Visit
Admission: RE | Admit: 2022-12-13 | Discharge: 2022-12-13 | Disposition: A | Discharge status: Home / Self Care | Payer: BC Managed Care – PPO | Source: Ambulatory Visit | Attending: Family Medicine | Admitting: Family Medicine

## 2022-12-13 DIAGNOSIS — Z1231 Encounter for screening mammogram for malignant neoplasm of breast: Secondary | ICD-10-CM | POA: Diagnosis present

## 2023-02-25 ENCOUNTER — Encounter: Payer: BC Managed Care – PPO | Admitting: Family Medicine

## 2023-03-12 ENCOUNTER — Encounter: Payer: BC Managed Care – PPO | Admitting: Family Medicine

## 2023-03-12 NOTE — Progress Notes (Deleted)
Complete physical exam  Patient: Elizabeth Stevens   DOB: 06-22-1980   42 y.o. Female  MRN: [id number]  Subjective:    No chief complaint on file.   Elizabeth Stevens is a 42 y.o. female who presents today for a complete physical exam. She reports consuming a {diet types:17450} diet. {types:19826} She generally feels {DESC; WELL/FAIRLY WELL/POORLY:18703}. She reports sleeping {DESC; WELL/FAIRLY WELL/POORLY:18703}. She {does/does not:200015} have additional problems to discuss today.    Discussed the use of AI scribe software for clinical note transcription with the patient, who gave verbal consent to proceed.  History of Present Illness           Most recent fall risk assessment:    09/17/2022    3:33 PM  Fall Risk   Falls in the past year? 0  Number falls in past yr: 0  Injury with Fall? 0  Risk for fall due to : No Fall Risks  Follow up Falls evaluation completed     Most recent depression screenings:    09/17/2022    3:33 PM 08/28/2021   10:50 AM  PHQ 2/9 Scores  PHQ - 2 Score 0 2  PHQ- 9 Score 0 3    {VISON DENTAL STD PSA (Optional):27386}  {History (Optional):23778}  Patient Care Team: Erasmo Downer, MD as PCP - General (Family Medicine)   Outpatient Medications Prior to Visit  Medication Sig   amLODipine (NORVASC) 5 MG tablet Take 1 tablet (5 mg total) by mouth daily.   Ascorbic Acid (VITAMIN C) 1000 MG tablet Take 1,000 mg by mouth daily.   buPROPion (WELLBUTRIN XL) 150 MG 24 hr tablet Take 1 tablet (150 mg total) by mouth daily.   Chlorpheniramine Maleate (ALLERGY PO) Take by mouth.   cholecalciferol (VITAMIN D3) 25 MCG (1000 UNIT) tablet Take 1,000 Units by mouth daily.   citalopram (CELEXA) 40 MG tablet Take 1 tablet (40 mg total) by mouth daily.   fluticasone (FLONASE) 50 MCG/ACT nasal spray Place 2 sprays into both nostrils daily.   Multiple Vitamin (MULTIVITAMIN) tablet Take 1 tablet by mouth daily.   norethindrone (MICRONOR) 0.35 MG  tablet Take 1 tablet (0.35 mg total) by mouth daily.   No facility-administered medications prior to visit.    ROS        Objective:     There were no vitals taken for this visit. {Vitals History (Optional):23777}  Physical Exam Vitals reviewed.  Constitutional:      General: She is not in acute distress.    Appearance: Normal appearance. She is well-developed. She is not diaphoretic.  HENT:     Head: Normocephalic and atraumatic.     Right Ear: Tympanic membrane, ear canal and external ear normal.     Left Ear: Tympanic membrane, ear canal and external ear normal.     Nose: Nose normal.     Mouth/Throat:     Mouth: Mucous membranes are moist.     Pharynx: Oropharynx is clear. No oropharyngeal exudate.  Eyes:     General: No scleral icterus.    Conjunctiva/sclera: Conjunctivae normal.     Pupils: Pupils are equal, round, and reactive to light.  Neck:     Thyroid: No thyromegaly.  Cardiovascular:     Rate and Rhythm: Normal rate and regular rhythm.     Heart sounds: Normal heart sounds. No murmur heard. Pulmonary:     Effort: Pulmonary effort is normal. No respiratory distress.     Breath sounds: Normal breath  sounds. No wheezing or rales.  Abdominal:     General: There is no distension.     Palpations: Abdomen is soft.     Tenderness: There is no abdominal tenderness.  Musculoskeletal:        General: No deformity.     Cervical back: Neck supple.     Right lower leg: No edema.     Left lower leg: No edema.  Lymphadenopathy:     Cervical: No cervical adenopathy.  Skin:    General: Skin is warm and dry.     Findings: No rash.  Neurological:     Mental Status: She is alert and oriented to person, place, and time. Mental status is at baseline.     Gait: Gait normal.  Psychiatric:        Mood and Affect: Mood normal.        Behavior: Behavior normal.        Thought Content: Thought content normal.      No results found for any visits on 03/12/23. {Show  previous labs (optional):23779}    Assessment & Plan:    Routine Health Maintenance and Physical Exam  Immunization History  Administered Date(s) Administered   Influenza,inj,Quad PF,6+ Mos 02/20/2019, 03/08/2020   Moderna Sars-Covid-2 Vaccination 07/18/2019, 08/15/2019   Tdap 02/20/2019    Health Maintenance  Topic Date Due   INFLUENZA VACCINE  12/20/2022   COVID-19 Vaccine (3 - 2023-24 season) 01/20/2023   Cervical Cancer Screening (HPV/Pap Cotest)  12/11/2023   MAMMOGRAM  12/12/2024   DTaP/Tdap/Td (2 - Td or Tdap) 02/19/2029   Hepatitis C Screening  Completed   HIV Screening  Completed   HPV VACCINES  Aged Out    Discussed health benefits of physical activity, and encouraged her to engage in regular exercise appropriate for her age and condition.  Problem List Items Addressed This Visit   None               No follow-ups on file.     Shirlee Latch, MD

## 2023-06-03 ENCOUNTER — Other Ambulatory Visit: Payer: Self-pay | Admitting: Family Medicine

## 2023-06-04 NOTE — Telephone Encounter (Signed)
 Requested Prescriptions  Pending Prescriptions Disp Refills   norethindrone  (MICRONOR ) 0.35 MG tablet [Pharmacy Med Name: NORETHINDRONE  0.35MG  TABLETS 28S] 84 tablet 0    Sig: TAKE 1 TABLET(0.35 MG) BY MOUTH DAILY     OB/GYN: Contraceptives - Progestins Passed - 06/04/2023 12:50 PM      Passed - Last BP in normal range    BP Readings from Last 1 Encounters:  09/17/22 125/85         Passed - Valid encounter within last 12 months    Recent Outpatient Visits           8 months ago Essential hypertension   [place] [facility] [place], Jon HERO, MD   1 year ago Essential hypertension   [place] [facility] [place], Jon HERO, MD   1 year ago Dysuria   [place] [facility] Cyndi Shaver, PA-C   1 year ago Encounter for annual physical exam   [place] [facility] [place], Jon HERO, MD   1 year ago GAD (generalized anxiety disorder)   [place] [facility], Jon HERO, MD              Passed - Patient is not a smoker

## 2023-07-25 ENCOUNTER — Encounter: Payer: Self-pay | Admitting: Family Medicine

## 2023-07-29 NOTE — Telephone Encounter (Signed)
 Yes ok to change to #30 [phone]

## 2023-07-30 MED ORDER — BUPROPION HCL ER (XL) 150 MG PO TB24: 150.0000 mg | ORAL_TABLET | Freq: Every day | ORAL | 5 refills | Status: DC

## 2023-08-19 ENCOUNTER — Ambulatory Visit (INDEPENDENT_AMBULATORY_CARE_PROVIDER_SITE_OTHER): Payer: Self-pay | Admitting: Family Medicine

## 2023-08-19 ENCOUNTER — Other Ambulatory Visit (HOSPITAL_COMMUNITY)
Admission: RE | Admit: 2023-08-19 | Discharge: 2023-08-19 | Disposition: A | Discharge status: Home / Self Care | Source: Ambulatory Visit | Attending: Family Medicine | Admitting: Family Medicine

## 2023-08-19 VITALS — BP 122/80 | HR 69 | Ht 67.5 in | Wt 274.1 lb

## 2023-08-19 DIAGNOSIS — Z6841 Body Mass Index (BMI) 40.0 and over, adult: Secondary | ICD-10-CM

## 2023-08-19 DIAGNOSIS — Z1231 Encounter for screening mammogram for malignant neoplasm of breast: Secondary | ICD-10-CM

## 2023-08-19 DIAGNOSIS — R739 Hyperglycemia, unspecified: Secondary | ICD-10-CM

## 2023-08-19 DIAGNOSIS — F411 Generalized anxiety disorder: Secondary | ICD-10-CM | POA: Diagnosis not present

## 2023-08-19 DIAGNOSIS — E782 Mixed hyperlipidemia: Secondary | ICD-10-CM | POA: Diagnosis not present

## 2023-08-19 DIAGNOSIS — I10 Essential (primary) hypertension: Secondary | ICD-10-CM

## 2023-08-19 DIAGNOSIS — Z0000 Encounter for general adult medical examination without abnormal findings: Secondary | ICD-10-CM

## 2023-08-19 DIAGNOSIS — Z124 Encounter for screening for malignant neoplasm of cervix: Secondary | ICD-10-CM | POA: Diagnosis present

## 2023-08-19 DIAGNOSIS — E66813 Obesity, class 3: Secondary | ICD-10-CM

## 2023-08-19 MED ORDER — NORETHINDRONE 0.35 MG PO TABS: 1.0000 | ORAL_TABLET | Freq: Every day | ORAL | 3 refills | Status: AC

## 2023-08-19 MED ORDER — CITALOPRAM HYDROBROMIDE 40 MG PO TABS: 40.0000 mg | ORAL_TABLET | Freq: Every day | ORAL | 3 refills | Status: AC

## 2023-08-19 MED ORDER — AMLODIPINE BESYLATE 5 MG PO TABS: 5.0000 mg | ORAL_TABLET | Freq: Every day | ORAL | 3 refills | Status: AC

## 2023-08-19 MED ORDER — BUPROPION HCL ER (XL) 150 MG PO TB24: 150.0000 mg | ORAL_TABLET | Freq: Every day | ORAL | 3 refills | Status: AC

## 2023-08-19 MED ORDER — FLUTICASONE PROPIONATE 50 MCG/ACT NA SUSP: 2.0000 | Freq: Every day | NASAL | 6 refills | Status: AC

## 2023-08-19 NOTE — Assessment & Plan Note (Signed)
 She manages anxiety with Wellbutrin XL 150 mg daily and Celexa 40 mg daily. Despite stress from grad school and other responsibilities, her anxiety is well-controlled. - Continue Wellbutrin XL 150 mg daily - Continue Celexa 40 mg daily

## 2023-08-19 NOTE — Assessment & Plan Note (Signed)
 Reviewed last lipid panel Not currently on a statin Recheck FLP and CMP Discussed diet and exercise

## 2023-08-19 NOTE — Patient Instructions (Signed)

## 2023-08-19 NOTE — Progress Notes (Signed)
 Complete physical exam   Patient: Elizabeth Stevens   DOB: Oct 27, 1980   43 y.o. Female  MRN: [id number] Visit Date: 08/19/2023  Today's healthcare provider: Shirlee Latch, MD   Chief Complaint  Patient presents with   Annual Exam    Last completed 08/28/21 Diet -  General  Exercise -  1 to 2 times a week for at least a 30 minute walk Feeling - well  Sleeping - well with some trouble falling asleep at times Concerns - none    Subjective    Elizabeth Stevens is a 43 y.o. female who presents today for a complete physical exam.    Discussed the use of AI scribe software for clinical note transcription with the patient, who gave verbal consent to proceed.  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 She reports struggling with weight loss since turning 38 and is interested in exploring options for weight loss medications. She has a history of anxiety and is currently on Wellbutrin XL and Celexa. She also takes Micronor for birth control and amlodipine for blood pressure. She denies any other health concerns at this time. She is also due for a mammogram, a Pap smear, and labs.        Last depression screening scores    08/19/2023    3:13 PM 09/17/2022    3:33 PM 08/28/2021   10:50 AM  PHQ 2/9 Scores  PHQ - 2 Score 1 0 2  PHQ- 9 Score 3 0 3   Last fall risk screening    08/19/2023    3:13 PM  Fall Risk   Falls in the past year? 0  Number falls in past yr: 0  Injury with Fall? 0  Risk for fall due to : No Fall Risks  Follow up Falls evaluation completed        Medications: Outpatient Medications Prior to Visit  Medication Sig   Ascorbic Acid (VITAMIN C) 1000 MG tablet Take 1,000 mg by mouth daily.   Chlorpheniramine Maleate (ALLERGY PO) Take by mouth.   cholecalciferol (VITAMIN D3) 25 MCG (1000 UNIT) tablet Take 1,000 Units by mouth daily.   Multiple Vitamin  (MULTIVITAMIN) tablet Take 1 tablet by mouth daily.   [DISCONTINUED] amLODipine (NORVASC) 5 MG tablet Take 1 tablet (5 mg total) by mouth daily.   [DISCONTINUED] buPROPion (WELLBUTRIN XL) 150 MG 24 hr tablet Take 1 tablet (150 mg total) by mouth daily.   [DISCONTINUED] citalopram (CELEXA) 40 MG tablet Take 1 tablet (40 mg total) by mouth daily.   [DISCONTINUED] fluticasone (FLONASE) 50 MCG/ACT nasal spray Place 2 sprays into both nostrils daily.   [DISCONTINUED] norethindrone (MICRONOR) 0.35 MG tablet TAKE 1 TABLET(0.35 MG) BY MOUTH DAILY   No facility-administered medications prior to visit.    Review of Systems    Objective    BP 122/80 (BP Location: Left Arm, Patient Position: Sitting, Cuff Size: Large)   Pulse 69   Ht 5' 7.5" (1.715 m)   Wt 274 lb 1.6 oz (124.3 kg)   BMI 42.30 kg/m    Physical Exam Vitals reviewed.  Constitutional:      General: She is not in acute distress.    Appearance: Normal appearance. She is well-developed. She is not diaphoretic.  HENT:     Head: Normocephalic and atraumatic.     Right Ear: Tympanic membrane, ear canal and external ear  normal.     Left Ear: Tympanic membrane, ear canal and external ear normal.     Nose: Nose normal.     Mouth/Throat:     Mouth: Mucous membranes are moist.     Pharynx: Oropharynx is clear. No oropharyngeal exudate.  Eyes:     General: No scleral icterus.    Conjunctiva/sclera: Conjunctivae normal.     Pupils: Pupils are equal, round, and reactive to light.  Neck:     Thyroid: No thyromegaly.  Cardiovascular:     Rate and Rhythm: Normal rate and regular rhythm.     Heart sounds: Normal heart sounds. No murmur heard. Pulmonary:     Effort: Pulmonary effort is normal. No respiratory distress.     Breath sounds: Normal breath sounds. No wheezing or rales.  Abdominal:     General: There is no distension.     Palpations: Abdomen is soft.     Tenderness: There is no abdominal tenderness.  Genitourinary:     Comments: GYN:  External genitalia within normal limits.  Vaginal mucosa pink, moist, normal rugae.  Nonfriable cervix without lesions, no discharge or bleeding noted on speculum exam.    Musculoskeletal:        General: No deformity.     Cervical back: Neck supple.     Right lower leg: No edema.     Left lower leg: No edema.  Lymphadenopathy:     Cervical: No cervical adenopathy.  Skin:    General: Skin is warm and dry.     Findings: No rash.  Neurological:     Mental Status: She is alert and oriented to person, place, and time. Mental status is at baseline.     Gait: Gait normal.  Psychiatric:        Mood and Affect: Mood normal.        Behavior: Behavior normal.        Thought Content: Thought content normal.      No results found for any visits on 08/19/23.  Assessment & Plan    Routine Health Maintenance and Physical Exam  Exercise Activities and Dietary recommendations  Goals   None     Immunization History  Administered Date(s) Administered   Influenza,inj,Quad PF,6+ Mos 02/20/2019, 03/08/2020   Moderna Sars-Covid-2 Vaccination 07/18/2019, 08/15/2019   Tdap 02/20/2019    Health Maintenance  Topic Date Due   COVID-19 Vaccine (3 - 2024-25 season) 01/20/2023   INFLUENZA VACCINE  08/19/2023 (Originally 12/20/2022)   Cervical Cancer Screening (HPV/Pap Cotest)  12/11/2023   MAMMOGRAM  12/12/2024   DTaP/Tdap/Td (2 - Td or Tdap) 02/19/2029   Hepatitis C Screening  Completed   HIV Screening  Completed   HPV VACCINES  Aged Out    Discussed health benefits of physical activity, and encouraged her to engage in regular exercise appropriate for her age and condition.  Problem List Items Addressed This Visit       Cardiovascular and Mediastinum   Essential hypertension   Her blood pressure is well-controlled on amlodipine 5 mg daily. - Continue amlodipine 5 mg daily - Monitor blood pressure regularly      Relevant Medications   amLODipine (NORVASC) 5 MG tablet    Other Relevant Orders   Comprehensive metabolic panel with GFR   Lipid panel     Other   Obesity   She struggles with weight loss, particularly after turning 38, and reports low energy levels. Currently in grad school, she experiences stress and time constraints. Various  weight loss medications were discussed, including Contrave (Wellbutrin plus naltrexone), phentermine, and Topamax. Contrave targets the craving center and may benefit those with food cravings. Phentermine is a short-term appetite suppressant. Topamax can be used alone or with phentermine to suppress cravings, especially in binge eaters. Insurance coverage is limited, particularly for GLP-1 injections, which are not covered for weight loss by her current plan. She is considering her options and may try lifestyle changes, including weightlifting to improve metabolism. - Consider weight loss medications such as Contrave, phentermine, or Topamax if lifestyle changes are insufficient - Encourage weightlifting to improve metabolism - Discuss weight loss options in future visits      Relevant Orders   Comprehensive metabolic panel with GFR   Lipid panel   Hemoglobin A1c   CBC w/Diff/Platelet   GAD (generalized anxiety disorder)   She manages anxiety with Wellbutrin XL 150 mg daily and Celexa 40 mg daily. Despite stress from grad school and other responsibilities, her anxiety is well-controlled. - Continue Wellbutrin XL 150 mg daily - Continue Celexa 40 mg daily      Relevant Medications   buPROPion (WELLBUTRIN XL) 150 MG 24 hr tablet   citalopram (CELEXA) 40 MG tablet   Moderate mixed hyperlipidemia not requiring statin therapy   Reviewed last lipid panel Not currently on a statin Recheck FLP and CMP Discussed diet and exercise       Relevant Medications   amLODipine (NORVASC) 5 MG tablet   Other Relevant Orders   Comprehensive metabolic panel with GFR   Lipid panel   Other Visit Diagnoses       Encounter for annual  physical exam    -  Primary   Relevant Orders   Comprehensive metabolic panel with GFR   Lipid panel   Hemoglobin A1c   CBC w/Diff/Platelet     Cervical cancer screening       Relevant Orders   Cytology - PAP     Breast cancer screening by mammogram       Relevant Orders   MM 3D SCREENING MAMMOGRAM BILATERAL BREAST     Hyperglycemia       Relevant Orders   Hemoglobin A1c          General Health Maintenance She is due for several routine health screenings and vaccinations. She had a mammogram in July 2024 and is due for another in July 2025. Her last Pap smear was in July 2020, and she is due for another. She will turn 14 in October 2025, at which point colon cancer screening will be necessary. Her tetanus vaccination is up to date until 2030. - Order mammogram for July 2025 - Perform Pap smear today - Discuss colon cancer screening at next visit        Return in about 6 months (around 02/18/2024) for chronic disease f/u.     Shirlee Latch, MD  [facility] [phone] (phone) [phone] (fax)  [facility]

## 2023-08-19 NOTE — Assessment & Plan Note (Signed)
 Her blood pressure is well-controlled on amlodipine 5 mg daily. - Continue amlodipine 5 mg daily - Monitor blood pressure regularly

## 2023-08-19 NOTE — Assessment & Plan Note (Signed)
 She struggles with weight loss, particularly after turning 38, and reports low energy levels. Currently in grad school, she experiences stress and time constraints. Various weight loss medications were discussed, including Contrave (Wellbutrin plus naltrexone), phentermine, and Topamax. Contrave targets the craving center and may benefit those with food cravings. Phentermine is a short-term appetite suppressant. Topamax can be used alone or with phentermine to suppress cravings, especially in binge eaters. Insurance coverage is limited, particularly for GLP-1 injections, which are not covered for weight loss by her current plan. She is considering her options and may try lifestyle changes, including weightlifting to improve metabolism. - Consider weight loss medications such as Contrave, phentermine, or Topamax if lifestyle changes are insufficient - Encourage weightlifting to improve metabolism - Discuss weight loss options in future visits

## 2023-08-20 ENCOUNTER — Encounter: Payer: Self-pay | Admitting: Family Medicine

## 2023-08-20 LAB — CBC WITH DIFFERENTIAL/PLATELET
Basophils Absolute: 0.1 10*3/uL (ref 0.0–0.2)
Basos: 1 %
EOS (ABSOLUTE): 0.2 10*3/uL (ref 0.0–0.4)
Eos: 2 %
Hematocrit: 39.4 % (ref 34.0–46.6)
Hemoglobin: 13.3 g/dL (ref 11.1–15.9)
Immature Grans (Abs): 0 10*3/uL (ref 0.0–0.1)
Immature Granulocytes: 0 %
Lymphocytes Absolute: 3 10*3/uL (ref 0.7–3.1)
Lymphs: 30 %
MCH: 31.7 pg (ref 26.6–33.0)
MCHC: 33.8 g/dL (ref 31.5–35.7)
MCV: 94 fL (ref 79–97)
Monocytes Absolute: 0.7 10*3/uL (ref 0.1–0.9)
Monocytes: 7 %
Neutrophils Absolute: 6.2 10*3/uL (ref 1.4–7.0)
Neutrophils: 60 %
Platelets: 375 10*3/uL (ref 150–450)
RBC: 4.2 x10E6/uL (ref 3.77–5.28)
RDW: 13 % (ref 11.7–15.4)
WBC: 10.3 10*3/uL (ref 3.4–10.8)

## 2023-08-20 LAB — COMPREHENSIVE METABOLIC PANEL WITH GFR
ALT: 10 IU/L (ref 0–32)
AST: 20 IU/L (ref 0–40)
Albumin: 4.1 g/dL (ref 3.9–4.9)
Alkaline Phosphatase: 59 IU/L (ref 44–121)
BUN/Creatinine Ratio: 12 (ref 9–23)
BUN: 12 mg/dL (ref 6–24)
Bilirubin Total: 0.4 mg/dL (ref 0.0–1.2)
CO2: 24 mmol/L (ref 20–29)
Calcium: 9.5 mg/dL (ref 8.7–10.2)
Chloride: 102 mmol/L (ref 96–106)
Creatinine, Ser: 1.01 mg/dL — ABNORMAL HIGH (ref 0.57–1.00)
Globulin, Total: 2.2 g/dL (ref 1.5–4.5)
Glucose: 83 mg/dL (ref 70–99)
Potassium: 4.4 mmol/L (ref 3.5–5.2)
Sodium: 139 mmol/L (ref 134–144)
Total Protein: 6.3 g/dL (ref 6.0–8.5)
eGFR: 70 mL/min/{1.73_m2} (ref >59)

## 2023-08-20 LAB — LIPID PANEL
Chol/HDL Ratio: 4.2 ratio (ref 0.0–4.4)
Cholesterol, Total: 216 mg/dL — ABNORMAL HIGH (ref 100–199)
HDL: 51 mg/dL (ref >39)
LDL Chol Calc (NIH): 128 mg/dL — ABNORMAL HIGH (ref 0–99)
Triglycerides: 212 mg/dL — ABNORMAL HIGH (ref 0–149)
VLDL Cholesterol Cal: 37 mg/dL (ref 5–40)

## 2023-08-20 LAB — HEMOGLOBIN A1C
Est. average glucose Bld gHb Est-mCnc: 108 mg/dL
Hgb A1c MFr Bld: 5.4 % (ref 4.8–5.6)

## 2023-08-22 ENCOUNTER — Encounter: Payer: Self-pay | Admitting: Family Medicine

## 2023-08-22 LAB — CYTOLOGY - PAP
Adequacy: ABSENT
Comment: NEGATIVE
Diagnosis: UNDETERMINED — AB
High risk HPV: NEGATIVE

## 2023-09-25 ENCOUNTER — Encounter: Payer: Self-pay | Admitting: Family Medicine

## 2023-12-06 ENCOUNTER — Ambulatory Visit: Payer: Self-pay

## 2023-12-06 ENCOUNTER — Telehealth: Admitting: Physician Assistant

## 2023-12-06 ENCOUNTER — Encounter

## 2023-12-06 ENCOUNTER — Other Ambulatory Visit (HOSPITAL_COMMUNITY): Payer: Self-pay

## 2023-12-06 DIAGNOSIS — L259 Unspecified contact dermatitis, unspecified cause: Secondary | ICD-10-CM

## 2023-12-06 MED ORDER — TRIAMCINOLONE ACETONIDE 0.1 % EX CREA: 1.0000 | TOPICAL_CREAM | Freq: Two times a day (BID) | CUTANEOUS | 0 refills | Status: AC

## 2023-12-06 MED ORDER — CETIRIZINE HCL 10 MG PO TABS: 10.0000 mg | ORAL_TABLET | Freq: Every day | ORAL | 0 refills | Status: AC

## 2023-12-06 NOTE — Patient Instructions (Signed)
  Makinze  L Constable, thank you for joining Teena Shuck, PA-C for today's virtual visit.  While this provider is not your primary care provider (PCP), if your PCP is located in our provider database this encounter information will be shared with them immediately following your visit.   A [facility] MyChart account gives you access to today's visit and all your visits, tests, and labs performed at [facility]  click here if you don't have a [facility] MyChart account or go to [email or web address]  Consent: (Patient) Elizabeth Stevens provided verbal consent for this virtual visit at the beginning of the encounter.  Current Medications:  Current Outpatient Medications:    amLODipine  (NORVASC ) 5 MG tablet, Take 1 tablet (5 mg total) by mouth daily., Disp: 90 tablet, Rfl: 3   Ascorbic Acid (VITAMIN C) 1000 MG tablet, Take 1,000 mg by mouth daily., Disp: , Rfl:    buPROPion  (WELLBUTRIN  XL) 150 MG 24 hr tablet, Take 1 tablet (150 mg total) by mouth daily., Disp: 90 tablet, Rfl: 3   Chlorpheniramine Maleate (ALLERGY PO), Take by mouth., Disp: , Rfl:    cholecalciferol (VITAMIN D3) 25 MCG (1000 UNIT) tablet, Take 1,000 Units by mouth daily., Disp: , Rfl:    citalopram  (CELEXA ) 40 MG tablet, Take 1 tablet (40 mg total) by mouth daily., Disp: 90 tablet, Rfl: 3   fluticasone  (FLONASE ) 50 MCG/ACT nasal spray, Place 2 sprays into both nostrils daily., Disp: 16 g, Rfl: 6   Multiple Vitamin (MULTIVITAMIN) tablet, Take 1 tablet by mouth daily., Disp: , Rfl:    norethindrone  (MICRONOR ) 0.35 MG tablet, Take 1 tablet (0.35 mg total) by mouth daily., Disp: 84 tablet, Rfl: 3   Medications ordered in this encounter:  No orders of the defined types were placed in this encounter.    *If you need refills on other medications prior to your next appointment, please contact your pharmacy*  Follow-Up: Call back or seek an in-person evaluation if the symptoms worsen or if the condition fails to  improve as anticipated.  [facility] [phone]  Other Instructions Please report to the nearest Emergency room with any worsening symptoms. Follow up with primary care provider (PCP) in 2 -3 days.    If you have been instructed to have an in-person evaluation today at a local Urgent Care facility, please use the link below. It will take you to a list of all of our available [facility], including address, phone number and hours of operation. Please do not delay care.  [facility]  If you or a family member do not have a primary care provider, use the link below to schedule a visit and establish care. When you choose a [facility] primary care physician or advanced practice provider, you gain a long-term partner in health. Find a Primary Care Provider  Learn more about [facility] in-office and virtual care options: [facility] - Get Care Now

## 2023-12-06 NOTE — Telephone Encounter (Signed)
 FYI

## 2023-12-06 NOTE — Progress Notes (Signed)
 Virtual Visit Consent   Elizabeth Stevens, you are scheduled for a virtual visit with a [facility] provider today. Just as with appointments in the office, your consent must be obtained to participate. Your consent will be active for this visit and any virtual visit you may have with one of our providers in the next 365 days. If you have a MyChart account, a copy of this consent can be sent to you electronically.  As this is a virtual visit, video technology does not allow for your provider to perform a traditional examination. This may limit your provider's ability to fully assess your condition. If your provider identifies any concerns that need to be evaluated in person or the need to arrange testing (such as labs, EKG, etc.), we will make arrangements to do so. Although advances in technology are sophisticated, we cannot ensure that it will always work on either your end or our end. If the connection with a video visit is poor, the visit may have to be switched to a telephone visit. With either a video or telephone visit, we are not always able to ensure that we have a secure connection.  By engaging in this virtual visit, you consent to the provision of healthcare and authorize for your insurance to be billed (if applicable) for the services provided during this visit. Depending on your insurance coverage, you may receive a charge related to this service.  I need to obtain your verbal consent now. Are you willing to proceed with your visit today? Elizabeth Stevens has provided verbal consent on 12/06/2023 for a virtual visit (video or telephone). Teena Shuck, [place]  Date: 12/06/2023 4:05 PM   Virtual Visit via Video Note   I, Teena Shuck, connected with  Elizabeth Stevens  ([id number], 1980/11/16) on 12/06/23 at  4:00 PM EDT by a video-enabled telemedicine application and verified that I am speaking with the correct person using two identifiers.  Location: Patient: Virtual Visit Location  Patient: Mobile Provider: Virtual Visit Location Provider: Home Office   I discussed the limitations of evaluation and management by telemedicine and the availability of in person appointments. The patient expressed understanding and agreed to proceed.    History of Present Illness: Elizabeth  L Stevens is a 43 y.o. who identifies as a female who was assigned female at birth, and is being seen today for contact dermatitis.  HPI: Rash This is a new problem. The current episode started in the past 7 days. The problem is unchanged. The affected locations include the abdomen. The rash is characterized by dryness and redness. She was exposed to nothing. Pertinent negatives include no anorexia, congestion, cough, diarrhea, eye pain, facial edema, fatigue, fever, joint pain, nail changes, rhinorrhea, shortness of breath, sore throat or vomiting. Past treatments include antihistamine. The treatment provided mild relief. Her past medical history is significant for allergies and eczema.    Problems:  Patient Active Problem List   Diagnosis Date Noted   Moderate mixed hyperlipidemia not requiring statin therapy 03/02/2022   Insomnia 03/08/2020   Galactorrhea 09/18/2018   Essential hypertension 12/03/2017   GAD (generalized anxiety disorder) 12/03/2017   Family history of breast cancer 10/18/2016   BRCA negative    Obesity     Allergies: No Known Allergies Medications:  Current Outpatient Medications:    amLODipine  (NORVASC ) 5 MG tablet, Take 1 tablet (5 mg total) by mouth daily., Disp: 90 tablet, Rfl: 3   Ascorbic Acid (VITAMIN C) 1000 MG tablet, Take 1,000  mg by mouth daily., Disp: , Rfl:    buPROPion  (WELLBUTRIN  XL) 150 MG 24 hr tablet, Take 1 tablet (150 mg total) by mouth daily., Disp: 90 tablet, Rfl: 3   Chlorpheniramine Maleate (ALLERGY PO), Take by mouth., Disp: , Rfl:    cholecalciferol (VITAMIN D3) 25 MCG (1000 UNIT) tablet, Take 1,000 Units by mouth daily., Disp: , Rfl:    citalopram   (CELEXA ) 40 MG tablet, Take 1 tablet (40 mg total) by mouth daily., Disp: 90 tablet, Rfl: 3   fluticasone  (FLONASE ) 50 MCG/ACT nasal spray, Place 2 sprays into both nostrils daily., Disp: 16 g, Rfl: 6   Multiple Vitamin (MULTIVITAMIN) tablet, Take 1 tablet by mouth daily., Disp: , Rfl:    norethindrone  (MICRONOR ) 0.35 MG tablet, Take 1 tablet (0.35 mg total) by mouth daily., Disp: 84 tablet, Rfl: 3  Observations/Objective: Patient is well-developed, well-nourished in no acute distress.  Resting comfortably  at home.  Head is normocephalic, atraumatic.  No labored breathing.  Speech is clear and coherent with logical content.  Patient is alert and oriented at baseline.  Small red patch noted left abdomen no fluctuance, drainage, vesicles or other lesions.   Assessment and Plan: 1. Contact dermatitis due to plants, except food, unspecified contact dermatitis type (Primary)  Patient presenting with contact dermatitis and eczema. Patient afebrile.  No evidence of infections including cellulitis, herpes zoster, atopic derm, measles, rubella. Patient prescribed steroid pack.  Advised to monitor exposures and record.  Supportive therapies discussed.   Follow up with primary provider in 2-3 days if symptoms continue. Report to ED if high fever, spread of rash, pain, or other concerns.  Follow Up Instructions: I discussed the assessment and treatment plan with the patient. The patient was provided an opportunity to ask questions and all were answered. The patient agreed with the plan and demonstrated an understanding of the instructions.  A copy of instructions were sent to the patient via MyChart unless otherwise noted below.    The patient was advised to call back or seek an in-person evaluation if the symptoms worsen or if the condition fails to improve as anticipated.    Teena Shuck, PA-C

## 2023-12-06 NOTE — Telephone Encounter (Signed)
 FYI Only or Action Required?: FYI only for provider.  Patient was last seen in primary care on 08/19/2023 by Myrla Jon HERO, MD.  Called Nurse Triage reporting Urticaria.  Symptoms began yesterday.  Interventions attempted: OTC medications: Calamine lotion/Benadryl.  Symptoms are: unchanged.  Triage Disposition: See Physician Within 24 Hours  Patient/caregiver understands and will follow disposition?: Yes  **Virtual Urgent Care visit scheduled for 7/18 for immediate evaluation-treatment**         Message from [facility] G sent at 12/06/2023 11:41 AM EDT  Hives and eczema issue.SABRA a lot of itching and uncomfortable..its patchy/spreading   Reason for Disposition  [1] MODERATE-SEVERE hives (e.g.,hives interfere with normal activities or work) AND [2] not improved after taking antihistamine (e.g., cetirizine, fexofenadine, or loratadine) > 24 hours  Answer Assessment - Initial Assessment Questions 1. APPEARANCE: What does the rash look like?      Areas are round in shape, red and raised  2. LOCATION: Where is the rash located?      Stomach area  3. NUMBER: How many hives are there?      4 areas  4. SIZE: How big are the hives? (e.g., inches, cm, compare to coins) Do they all look the same or do they vary in shape and size?      Finger tip in in sizes  5. ONSET: When did the hives begin? (e.g., hours or days ago)      X 1 day  6. ITCHING: Does it itch? If Yes, ask: How bad is the itch?  (e.g., none, mild, moderate, severe)     Yes, Benadryl and Calamine lotion, some relief noted.    7. RECURRENT PROBLEM: Have you had hives before? If Yes, ask: When was the last time? and What happened that time?      Yes, allergic to dog dander  8. TRIGGERS: Were you exposed to any new food, plant, cosmetic product or animal just before the hives began?     Dogs   9. OTHER SYMPTOMS: Do you have any other symptoms? (e.g., fever, tongue swelling, difficulty  breathing, abdomen pain)     No  Protocols used: Hives-A-AH

## 2023-12-10 ENCOUNTER — Other Ambulatory Visit (HOSPITAL_COMMUNITY): Payer: Self-pay

## 2023-12-16 ENCOUNTER — Ambulatory Visit
Admission: RE | Admit: 2023-12-16 | Discharge: 2023-12-16 | Disposition: A | Discharge status: Home / Self Care | Source: Ambulatory Visit | Attending: Family Medicine | Admitting: Family Medicine

## 2023-12-16 DIAGNOSIS — Z1231 Encounter for screening mammogram for malignant neoplasm of breast: Secondary | ICD-10-CM | POA: Insufficient documentation

## 2023-12-18 ENCOUNTER — Ambulatory Visit: Payer: Self-pay | Admitting: Family Medicine

## 2024-02-18 ENCOUNTER — Ambulatory Visit: Admitting: Family Medicine

## 2024-03-10 ENCOUNTER — Ambulatory Visit: Admitting: Family Medicine

## 2024-05-17 ENCOUNTER — Telehealth

## 2024-06-08 ENCOUNTER — Ambulatory Visit: Admitting: Family Medicine

## 2024-08-20 ENCOUNTER — Ambulatory Visit: Admitting: Family Medicine
